# Patient Record
Sex: Female | Born: 1960 | Race: White | Hispanic: No | Smoking: Former smoker
Health system: Southern US, Community
[De-identification: ages and names within clinical notes are randomized; demographics above are authoritative.]

## PROBLEM LIST (undated history)

## (undated) DIAGNOSIS — J45909 Unspecified asthma, uncomplicated: Secondary | ICD-10-CM

## (undated) DIAGNOSIS — R5383 Other fatigue: Secondary | ICD-10-CM

## (undated) DIAGNOSIS — G459 Transient cerebral ischemic attack, unspecified: Secondary | ICD-10-CM

## (undated) DIAGNOSIS — R0600 Dyspnea, unspecified: Secondary | ICD-10-CM

## (undated) HISTORY — PX: TOTAL KNEE ARTHROPLASTY: SHX125

## (undated) HISTORY — DX: Dyspnea, unspecified: R06.00

## (undated) HISTORY — PX: HERNIA REPAIR: SHX51

## (undated) HISTORY — PX: SPLENECTOMY, TOTAL: SHX788

## (undated) HISTORY — DX: Other fatigue: R53.83

## (undated) HISTORY — PX: HIP FRACTURE SURGERY: SHX118

## (undated) HISTORY — PX: ABDOMINAL HYSTERECTOMY: SHX81

## (undated) HISTORY — PX: HYSTEROSCOPY: SHX211

## (undated) HISTORY — PX: BACK SURGERY: SHX140

## (undated) HISTORY — DX: Unspecified asthma, uncomplicated: J45.909

## (undated) HISTORY — DX: Transient cerebral ischemic attack, unspecified: G45.9

---

## 1998-09-29 ENCOUNTER — Other Ambulatory Visit: Admission: RE | Admit: 1998-09-29 | Discharge: 1998-09-29 | Payer: Self-pay

## 1999-09-16 ENCOUNTER — Encounter: Admission: RE | Admit: 1999-09-16 | Discharge: 1999-09-16 | Payer: Self-pay | Admitting: *Deleted

## 2000-10-30 ENCOUNTER — Other Ambulatory Visit: Admission: RE | Admit: 2000-10-30 | Discharge: 2000-10-30 | Payer: Self-pay | Admitting: *Deleted

## 2003-06-13 ENCOUNTER — Ambulatory Visit (HOSPITAL_BASED_OUTPATIENT_CLINIC_OR_DEPARTMENT_OTHER): Admission: RE | Admit: 2003-06-13 | Discharge: 2003-06-13 | Payer: Self-pay | Admitting: Orthopedic Surgery

## 2003-10-04 ENCOUNTER — Inpatient Hospital Stay (HOSPITAL_COMMUNITY): Admission: RE | Admit: 2003-10-04 | Discharge: 2003-10-07 | Payer: Self-pay | Admitting: Orthopedic Surgery

## 2008-04-09 ENCOUNTER — Inpatient Hospital Stay (HOSPITAL_COMMUNITY): Admission: EM | Admit: 2008-04-09 | Discharge: 2008-04-10 | Payer: Self-pay | Admitting: Emergency Medicine

## 2008-04-09 ENCOUNTER — Ambulatory Visit: Payer: Self-pay | Admitting: Internal Medicine

## 2008-04-10 ENCOUNTER — Encounter (INDEPENDENT_AMBULATORY_CARE_PROVIDER_SITE_OTHER): Payer: Self-pay | Admitting: Internal Medicine

## 2008-09-08 ENCOUNTER — Ambulatory Visit (HOSPITAL_COMMUNITY): Admission: RE | Admit: 2008-09-08 | Discharge: 2008-09-08 | Payer: Self-pay | Admitting: Internal Medicine

## 2008-09-08 ENCOUNTER — Encounter: Payer: Self-pay | Admitting: Internal Medicine

## 2008-09-08 ENCOUNTER — Ambulatory Visit: Payer: Self-pay | Admitting: Internal Medicine

## 2009-11-26 ENCOUNTER — Ambulatory Visit (HOSPITAL_COMMUNITY): Admission: RE | Admit: 2009-11-26 | Discharge: 2009-11-26 | Payer: Self-pay | Admitting: Orthopedic Surgery

## 2010-02-13 ENCOUNTER — Ambulatory Visit (HOSPITAL_COMMUNITY): Admission: RE | Admit: 2010-02-13 | Discharge: 2010-02-13 | Payer: Self-pay | Admitting: *Deleted

## 2010-08-01 LAB — POCT I-STAT, CHEM 8
Chloride: 95 mEq/L — ABNORMAL LOW (ref 96–112)
Hemoglobin: 11.9 g/dL — ABNORMAL LOW (ref 12.0–15.0)
Potassium: 3.7 mEq/L (ref 3.5–5.1)

## 2010-08-05 ENCOUNTER — Ambulatory Visit
Admission: RE | Admit: 2010-08-05 | Discharge: 2010-08-05 | Payer: Self-pay | Source: Home / Self Care | Attending: Orthopedic Surgery | Admitting: Orthopedic Surgery

## 2010-08-06 NOTE — Op Note (Addendum)
  Charlotte Wallace, Charlotte Wallace                ACCOUNT NO.:  [id number]  MEDICAL RECORD NO.:  [id number]          PATIENT TYPE:  AMB  LOCATION:  DSC                          FACILITY:  [facility]  PHYSICIAN:  Feliberto Gottron. Turner Daniels, M.D.   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  PROCEDURE:  Right knee arthroscopic debridement chondromalacia with abrasion arthroplasty.  SURGEON:  Feliberto Gottron. Turner Daniels, MD  FIRST ASSISTANT:  Shirl Harris, PA-C  ANESTHETIC:  General LMA.  ESTIMATED BLOOD LOSS:  Minimal.  FLUID REPLACEMENT:  800 mL of crystalloid.  DRAINS PLACED:  None.  TOURNIQUET TIME:  None.  INDICATIONS FOR PROCEDURE:  The patient is a 50 year old woman with catching, popping, and pain in her right knee.  Her left knee has been replaced.  Plain radiographs showed no evidence of significant arthritis.  She has failed conservative measures with anti-inflammatory medicines, exercises, and cortisone injection and desires elective right knee arthroscopy to remove torn cartilage either medial meniscal tear or chondromalacia.  Risks and benefits of surgery have been discussed, questions answered.  DESCRIPTION OF PROCEDURE:  The patient was identified by armband and received preoperative IV antibiotics in the holding area at [facility].  Appropriate anesthetic monitors were attached and general LMA anesthesia was induced with the patient in supine position in operating room #1 after placing appropriate anesthetic monitors. Lateral post was applied to the table.  Right lower extremity prepped and draped in the usual sterile fashion from the ankle to the midthigh. A time-out procedure performed.  We  began the operation by making standard inferomedial and inferolateral peripatellar portals allowing introduction of the arthroscope through the inferolateral portal and the outflow through the inferomedial portal.  Diagnostic arthroscopy revealed grade 3 chondromalacia with small flap tears to the medial facet of patella and the medial femoral condyle had a grade 3 flap tears as well to the medial femoral condyle.  This was over about a 4-mm x 6- mm area.  These flap tears were debrided with a 3.5 Gator sucker shaver down to a small area of bare bone where an abrasion arthroplasty was accomplished.  The ACL and PCL were intact.  The menisci were in excellent condition.  The gutters were cleared medially and laterally and the posterior horns were thoroughly probed.  At this point, the knee was irrigated out with normal saline solution.  The arthroscopic instrument were removed.  A dressing of Xeroform, 4 x 4 dressing sponges, Webril, and Ace wrap was applied.  The patient was awakened, extubated, and taken to the recovery room without difficulty.     Feliberto Gottron. Turner Daniels, M.D.     Ovid Curd  D:  08/05/2010  T:  08/06/2010  Job:  [id number]  Electronically Signed by Gean Birchwood M.D. on 08/06/2010 08:22:59 PM

## 2010-09-20 LAB — COMPREHENSIVE METABOLIC PANEL
AST: 15 U/L (ref 0–37)
Albumin: 3.9 g/dL (ref 3.5–5.2)
Alkaline Phosphatase: 72 U/L (ref 39–117)
BUN: 2 mg/dL — ABNORMAL LOW (ref 6–23)
CO2: 28 mEq/L (ref 19–32)
Calcium: 9.1 mg/dL (ref 8.4–10.5)
GFR calc Af Amer: >60 mL/min (ref >60)
GFR calc non Af Amer: >60 mL/min (ref >60)
Glucose, Bld: 90 mg/dL (ref 70–99)
Potassium: 3.7 mEq/L (ref 3.5–5.1)
Sodium: 138 mEq/L (ref 135–145)

## 2010-09-20 LAB — CBC
MCHC: 32.8 g/dL (ref 30.0–36.0)
MCV: 98.6 fL (ref 78.0–100.0)
Platelets: 186 10*3/uL (ref 150–400)
RBC: 3.49 MIL/uL — ABNORMAL LOW (ref 3.87–5.11)
RBC: 3.86 MIL/uL — ABNORMAL LOW (ref 3.87–5.11)
WBC: 6.3 10*3/uL (ref 4.0–10.5)

## 2010-09-20 LAB — DIFFERENTIAL
Eosinophils Relative: 1 % (ref 0–5)
Lymphs Abs: 1.7 10*3/uL (ref 0.7–4.0)
Neutrophils Relative %: 59 % (ref 43–77)

## 2010-09-20 LAB — TYPE AND SCREEN

## 2010-09-20 LAB — ABO/RH: ABO/RH(D): A POS

## 2010-09-20 LAB — APTT: aPTT: 30 seconds (ref 24–37)

## 2010-09-23 LAB — CREATININE, SERUM
Creatinine, Ser: 0.69 mg/dL (ref 0.4–1.2)
GFR calc Af Amer: >60 mL/min (ref >60)

## 2010-11-19 NOTE — Consult Note (Signed)
NAME:  STEVEN, VEAZIE NO.:  [id number]   MEDICAL RECORD NO.:  [id number]          PATIENT TYPE:  EMS   LOCATION:  MAJO                         FACILITY:  [facility]   PHYSICIAN:  Noel Christmas, MD    DATE OF BIRTH:  02-Sep-1960   DATE OF CONSULTATION:  DATE OF DISCHARGE:                                 CONSULTATION   REFERRING PHYSICIAN:  Hilario Quarry, MD   REASON FOR CONSULTATION:  Recurrent ischemic cerebral event.   This is a 50 year old lady who presented today following acute onset of  weakness and numbness involving right side of her face as well as right  arm and right leg.  Symptoms lasted for about one and half hours prior  to clearing, except for residual tingling involving right hand.  The  patient had a similar episode 3 days ago with numbness and weakness  involving face, arm, and leg on the right but lasting only about 10  minutes.  The patient did not seek medical attention at that time.  There is no previous history of stroke or TIA.  CT of her head showed no  acute intracranial abnormality.  The patient has not been on  antiplatelet therapy and is allergic to aspirin.  She has had no  associated headache with the presenting symptoms.  There was no aphasia  apparently according to the family members.   PAST MEDICAL HISTORY:  Remarkable for hypertension, COPD, and  hypothyroidism as well as nicotine abuse.   CURRENT MEDICATIONS:  1. Norvasc 5 mg per day.  2. Synthroid 137 mcg per day.  3. Combivent 2 puffs t.i.d.  4. Proventil 2 puffs q.4 h.  5. The patient started nicotine patch 21 mg 4 days ago.   FAMILY HISTORY:  Positive for type 2 diabetes mellitus as well as  hypertension and coronary artery disease.  There is no family history of  stroke.   PHYSICAL EXAMINATION:  GENERAL:  Appearance is that of a middle-aged  lady, slender to medium-build who was alert and cooperative in no acute  distress.  She was well oriented to time as well as  place.  Short term  and long term memory were normal.  Affect was appropriate.  HEENT:  Her pupils were equal and reacted normally to light.  Extraocular movement exam showed bilateral mild esotropia.  There was no  diplopia in any field of gaze, nor was there any evidence of visual  field defect.  There was no facial weakness.  Hearing was normal.  Speech and palatal movement were normal.  Coordination of extremities  was normal.  Strength and muscle tone were throughout.  Deep tendon  reflexes were normal and symmetrical throughout.  Plantar responses were  flexor.  Sensory examination was normal.  Carotid auscultation was  normal.   CLINICAL IMPRESSION:  Recurrent transient ischemic attacks involving the  left middle cerebral artery territory.  Left subcortical acute cerebral  infarction cannot be ruled out at this point.   RECOMMENDATIONS:  1. Plavix 75 mg per day.  2. MRI of the brain and MRA of the  brain and neck in the a.m.  3. Echocardiogram.   Thank you for asking me to evaluate Ms. Gille.      Noel Christmas, MD  Electronically Signed     CS/MEDQ  D:  04/09/2008  T:  04/09/2008  Job:  [id number]

## 2010-11-22 NOTE — Op Note (Signed)
NAME:  Charlotte Wallace, Charlotte Wallace                          ACCOUNT NO.:  [id number]   MEDICAL RECORD NO.:  [id number]                   PATIENT TYPE:  INP   LOCATION:  2899                                 FACILITY:  [facility]   PHYSICIAN:  Feliberto Gottron. Turner Daniels, M.D.                DATE OF BIRTH:  [birth year]   DATE OF PROCEDURE:  10/04/2003  DATE OF DISCHARGE:                                 OPERATIVE REPORT   PREOPERATIVE DIAGNOSIS:  End-stage arthritis of the left knee.   POSTOPERATIVE DIAGNOSIS:  End-stage arthritis of the left knee.   PROCEDURE:  Left total knee arthroplasty.  Osteonics Scorpio components, 7  femur, 7 tibia, 5 modified medial patellar button, 10 PF spacer.   SURGEON:  Feliberto Gottron. Turner Daniels, M.D.   FIRST ASSISTANT:  Erskine Squibb B. Jannet Mantis.   ANESTHESIA:  General endotracheal with sciatic nerve block and femoral nerve  block.   FLUIDS REPLACED:  1500 mL crystalloid.   ESTIMATED BLOOD LOSS:  Zero.   TOURNIQUET TIME:  1 hour 15 minutes.   INDICATION FOR PROCEDURE:  A 50 year old woman with end-stage arthritis of  the left knee proven by arthroscopy last year, where we found bare-bone  arthritic changes of the lateral compartment that were not readily visible  on either the x-ray or MRI scan.  Because of the bare-bone arthritic changes  and failure of all other conservative measures, total knee arthroplasty is  elected by the patient.  Risks and benefits of surgery are understood by the  patient.   DESCRIPTION OF PROCEDURE:  The patient identified by arm band, taken to the  operating room at [facility], appropriate anesthetic monitors  were attached and femoral nerve block and sciatic nerve block anesthesia  induced, followed by general endotracheal anesthesia.  Tourniquet applied  high to the left thigh, lateral post and foot positioner applied to the  table, left lower extremity prepped and draped in the usual sterile fashion  from the ankle to the tourniquet.  Limb  wrapped with an Esmarch bandage,  tourniquet inflated to 350 mmHg.  We began the procedure by making the  anterior midline incision through the skin and subcutaneous tissue straight  anterior, about 18 cm in length, centered over the patella.  Small bleeders  in the skin and subcutaneous tissue identified and cauterized with the  electrocautery.  The transverse retinaculum was incised and reflected  medially and the medial parapatellar arthrotomy accomplished.  Prepatellar  fat pad and the anterior one-half of the medial and lateral menisci were  resected.  The superficial medial collateral ligament was elevated from  anterior to posterior off the tibia, keeping it intact distally, and the ACL  and the PCL were reflected with the knee hyperflexed with the  electrocautery.  A posteromedial Z retractor was placed, a posterior McCale  retractor through the notch, and a lateral Hohmann retractor to give [place] our  proximal tibial exposure, and then the proximal tibia was entered with the  Osteonics step drill, followed by the IM rod, with the 0 degree posterior  slope cutting guide attached.  This was pinned into place, allowing  resection of 7-8 mm of bone medially and laterally with the power saw.  We  then entered the distal femur with the step drill 2 mm anterior to the PCL  origin, followed by the IM rod set for 5 degrees left with a 10 mm distal  cut.  The cutting guide was pinned into place and the cut accomplished.  We  sized for a #7 femoral component, placed the pins in 0 degrees of external  rotation, and then hammered the chamfer cutting guide into place and  accomplished the anterior, posterior, and chamfer cuts without difficulty,  followed by the Scorpio box cut after pinning the box cutting guide into  place.  The everted patella was grasped with the patellar cutting guide and  the posterior 8 mm resected since it was relatively thin, sized for a #5  modified medial patellar  button, and drilled.  A #5 left trial femoral  component was hammered into place, a #7 tibial base plate with a #16 PF  spacer was placed, and a 5 modified medial patellar trial button was placed.  The knee was taken through a range of motion.  Excellent stability and  ligamentous tension was noted in full extension, flexion, and through  midflexion.  The rotation of the tibial base plate  was set over the second  metatarsal and the trial components were removed.  The knee was once again  hyperflexed, exposing the proximal tibia.  The base plate for the tibial 7  was then pinned into place and the delta fit keel punch was accomplished.  All bony surfaces were water-picked clean and dried with suction and  sponges.  A double batch of Palacos polymethyl methacrylate with 1500 mg of  Zinacef was mixed and applied to all bony and metallic mating surfaces  except for the posterior condyles of the femur.  In order we then hammered  into place the tibial base plate #7, a #7 left femoral component, the 10 mm  PF spacer was snapped into the base plate after removing excess cement, and  the 5 modified medial patellar button was clamped into place and excess  cement removed.  Once again the surfaces were water-picked clean and medium  Hemovacs placed deep in the wound as the cement hardened.  After the cement  hardened, the patella was unclamped.  The knee was taken through a range of  motion and no thumb pressure was required for excellent tracking of the  patella.  The wound was then closed in layers with #1 Vicryl suture in the  parapatellar arthrotomy, 0 and 2-0 undyed Vicryl suture in the subcutaneous  tissue running, and then skin staples.  A dressing of Xeroform, 4 x 4  dressing sponges, Webril, and an Ace wrap applied.  The patient was then  awakened and taken to the recovery room without difficulty.                                               Feliberto Gottron. Turner Daniels, M.D.   Ovid Curd  D:   10/04/2003  T:  10/04/2003  Job:  [id number]

## 2010-11-22 NOTE — Discharge Summary (Signed)
NAMEEMILI, MCLOUGHLIN                ACCOUNT NO.:  [id number]   MEDICAL RECORD NO.:  [id number]          PATIENT TYPE:  INP   LOCATION:  3019                         FACILITY:  [facility]   PHYSICIAN:  Chauncey Reading, D.O.  DATE OF BIRTH:  [birth year]   DATE OF ADMISSION:  04/09/2008  DATE OF DISCHARGE:  04/10/2008                               DISCHARGE SUMMARY   DISCHARGE DIAGNOSES:  1. Transient ischemic attack.  2. Hypertension.  3. Hypothyroidism.  4. Chronic obstructive pulmonary disease.   DISCHARGE MEDICATIONS:  1. Plavix 75 mg p.o. daily.  2. Norvasc 5 mg p.o. daily.  3. Synthroid 125 mcg p.o. daily.  4. NicoDerm patch 1 daily as instructed.  5. Proventil 2 puffs once every 4 hours as needed.  6. Combivent 3 times daily.   CONDITION AT DISCHARGE:  The patient was admitted for a TIA.  The  patient's symptoms resolved in less than 24 hours.  The patient will  need consideration of decreasing Synthroid dose.  Thyroid-stimulating  hormone should be rechecked.  The patient will follow up with Dr.  Clovis Riley on April 13, 2008, at 2:15 p.m.  The patient is medically  stable to go home.   PROCEDURES:  1. MRI of the head.  Impression:      a.     No acute infarct.      b.     No abnormal intracranial enhancing lesions.      c.     Mild nonspecific white matter-type changes.  2. MRA of the head.  Impression:      a.     Mild irregularity of branch vesicles.  This may represent       limitation of present exam versus atherosclerotic-type changes.      b.     Ectasia of the vertebrobasilar system.  Nonvisualization of       the right posterior inferior communicating artery and both       anterior inferior communicating arteries.      c.     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  3. MRA of the neck.  Impression:      a.     Slightly limited examination  secondary to venous       contamination and motion.      b.     No hemodynamically significant stenosis or significant       irregularity involving either carotid bifurcations.      c.     Left vertebral artery is dominant.  4. CT of the head without contrast.  Impression:      a.     No acute intracranial findings.  Please note that acute       cerebrovascular accident can be occult on CT scan.  5. A 2-D echocardiogram.  Summary:      a.     Overall left ventricular systolic function was normal.  Left  ventricular ejection fraction was estimated to be 65%.  There were       no left ventricular regional wall motion abnormalities.  Left       ventricular wall thickness was mildly to moderately increased.      b.     Aortic valve thickness was mildly increased.      c.     The aortic root was at the upper limits of normal size.  Impression:  Although no acute cardiac source of embolism was  identified, the possibility cannot be ruled out on the basis of the  study.  If clinically indicated, transesophageal echocardiogram would  have greater sensitivity for detection of cardiac sources of embolism.   CONSULTATION:  Dr. Pearlean Brownie, MD, specialty is Neurology.   ADMITTING HISTORY AND PHYSICAL/HISTORY OF PRESENT ILLNESS:  A 50-year-  old woman comes to the hospital complaining of right-sided weakness,  that started at 2:30 p.m.  The patient states the symptoms started  suddenly.  She had to sit down because she experienced right leg and arm  weakness and right face tingling.  Her daughter called EMS as soon as  symptoms started.  The patient had a previous episode on Thursday before  admission in the same side that resolved after 50 minutes.  The patient  denies any speech difficulties although she states that she was not  talking very much.  By the time the patient was seen, all symptoms had  resolved except for tingling in her right arm.  The patient states that  symptoms were associated  with headache.  The headache is in the frontal  region and changes in intensity with change of head position.  The  headache is alleviated with Tylenol.  The patient did not complain of  nasal secretions.  She suffers from these headaches frequently.   ALLERGIES:  ASPIRIN, as an infant developed some side effects due to  ASPIRIN.   PHYSICAL EXAMINATION:  VITAL SIGNS:  Blood pressure 158/88, pulse 81,  respiratory rate 20, oxygen saturation 100 on 2 liters.  GENERAL:  NAD.  Eyes, EOMI.  No scleral icterus.  ENT, moist oral  mucosa.  No exudates.  NECK:  No JVD, no masses.  RESPIRATORY:  Expiratory wheezes from mid lung to base bilaterally, no  crackles, no rhonchi.  CARDIOVASCULAR:  Regular rate and rhythm.  No murmurs, rubs, or gallops.  GASTROINTESTINAL:  Bowel sounds positive, soft and depressible,  nontender, nondistended.  EXTREMITIES:  No edema, no cyanosis.  Tingling in left arm.  LYMPHADENOPATHY:  No lymphadenopathy.  MUSCULOSKELETAL:  A 4/5 strength on right upper extremity, 5/5 strength  on left upper extremity and lower extremities bilaterally.  NEUROLOGIC:  Alert and oriented x3, cranial nerves II-XII intact, left  arm has decreased light touch sensation.   ADMISSION LABORATORY:  White blood cells 6.2, hemoglobin 13.6,  hematocrit 39.6, platelets 180.  Sodium 139, potassium 3.9, chloride  103, bicarbonate 27, BUN 4, creatinine 0.7, glucose 97.  PT 12.4, INR  0.9, PTT 28.   HOSPITAL COURSE:  1. Transient ischemic attack:  By the time the patient was evaluated      in the emergency room, all symptoms had resolved except for some      mild tingling in her right arm.  The patient was started on Plavix      because of allergy to aspirin.  The patient was admitted for      observation.  The patient had multiple risk factors including  smoking, hypertension, and prior TIA.  CT of the head without      contrast was done, which showed no acute intracranial findings.       Neurology was consulted, and they recommended the patient be      started on Plavix 75 mg per day.  MRI of the brain and MRA of the      brain and neck which showed above-mentioned findings.  A 2-D echo      was done, which showed above-mentioned findings.  A 2-D echo did      not show a cardiac source of embolism, but the patient may need a      transesophageal echocardiography on an outpatient basis, which has      greater sensitivity.  HIV antibody, nonreactive.  UDS was negative.      Hemoglobin A1c was 5.  Cardiac enzymes were negative.  EKGs were      normal.  The patient will be discharged on Plavix because of the      allergy to aspirin.  The patient will follow up with her primary      care physician.  The patient will follow up with Dr. Pearlean Brownie in 2-3      months.  It is also recommended that the patient stop smoking.  2. Hypothyroidism:  It was found the patient had a low TSH of 0.268.      Primary care physician should further assess the need to decrease      Synthroid or recheck thyroid hormone levels.  The patient will be      discharged on a decreased dosage of Synthroid.  3. Hypertension:  The patient was started on Norvasc 5 mg p.o. daily.      Blood pressure was stable at goal throughout hospitalization.  The      patient will follow up with primary care Birtha Hatler for further      management.  4. Chronic obstructive pulmonary disease:  The patient did not have      any complaints of shortness of breath while in the hospital.  The      patient will continue with Proventil 2 puffs once every 4 hours as      needed on an outpatient basis.  The patient will follow up with      primary care physician for further management.   DISCHARGE VITAL SIGNS:  Temperature 98.2, blood pressure 156/87, pulse  81, respiratory rate 18, oxygen saturation of 97% on 2 liters.   DISCHARGE LABORATORY:  Sodium 138, potassium 3.6, chloride 106,  bicarbonate 26, glucose 97, BUN 4, creatinine 0.65.   White blood cells  7.4, hemoglobin 12.1, hematocrit 35.9, platelets 136.      Danne Harbor, MD  Electronically Signed      Chauncey Reading, D.O.  Electronically Signed    RV/MEDQ  D:  05/29/2008  T:  05/30/2008  Job:  [id number]   cc:   Noel Christmas, MD

## 2010-11-22 NOTE — Discharge Summary (Signed)
NAME:  LENOLA, LOCKNER                          ACCOUNT NO.:  [id number]   MEDICAL RECORD NO.:  [id number]                   PATIENT TYPE:  INP   LOCATION:  5036                                 FACILITY:  [facility]   PHYSICIAN:  Feliberto Gottron. Turner Daniels, M.D.                DATE OF BIRTH:  01/28/1961   DATE OF ADMISSION:  10/04/2003  DATE OF DISCHARGE:  10/07/2003                                 DISCHARGE SUMMARY   No dictation for this job.      Laural Benes. Jannet Mantis.                     Feliberto Gottron. Turner Daniels, M.D.    Merita Norton  D:  10/30/2003  T:  10/30/2003  Job:  [id number]

## 2010-11-22 NOTE — Op Note (Signed)
NAME:  Charlotte Wallace, Charlotte Wallace                          ACCOUNT NO.:  [id number]   MEDICAL RECORD NO.:  [id number]                   PATIENT TYPE:  AMB   LOCATION:  DSC                                  FACILITY:  [facility]   PHYSICIAN:  Feliberto Gottron. Turner Daniels, M.D.                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   POSTOPERATIVE DIAGNOSES:  Left knee medial meniscal tear, lateral meniscal  tear, chondromalacia of the lateral femoral condyle, grade 4 chondromalacia  of the lateral tibial condyle focal grade 4.   OPERATION PERFORMED:  Left knee partial arthroscopic medial and lateral  meniscectomies, debridement of chondromalacia and removal of loose bodies  from the left knee.   SURGEON:  Feliberto Gottron. Turner Daniels, M.D.   ANESTHESIA:  General LMA.   ESTIMATED BLOOD LOSS:  Minimal.   FLUIDS REPLACED:  600 mL crystalloids.   DRAINS:  None.   TOURNIQUET TIME:  None.   INDICATIONS FOR PROCEDURE:  The patient is a 50 year old woman who has had  prior surgery to her left knee under the care of Dr. __________ where he did  open medial meniscectomy back in the early 90s.  She now has recurrent  catching, popping and pain in her left knee and because of this is taken for  arthroscopic evaluation and treatment of her knee.  Findings prior to  surgery included x-rays showing medial joint line arthritis with a loss of  articular cartilage height about 1 or 2 mm squaring off of the medial  femoral condyle.  She is also tender along the lateral jointline where there  is some palpable clicking and popping.  She has failed conservative  treatment with observation and anti-inflammatory medicines and desires  elective arthroscopic evaluation and treatment of her left knee.   DESCRIPTION OF PROCEDURE:  The patient was identified by arm band and take  to the operating room at [facility].  Appropriate  anesthetic monitors were attached.  General LMA anesthesia was induced with  the patient in the supine position.  Lateral post was applied to the table  and left lower extremity was prepped and draped in the usual sterile fashion  from the ankle to the midthigh and using a #11 blade, standard inferomedial  and inferolateral peripatellar portals were then made allowing introduction  of the arthroscope through the inferolateral portal and the outflow through  the inferomedial portal.  Diagnostic arthroscopy revealed grade 3  chondromalacia along the apex of the patella where there was a vertical  crack and this was debrided back to stable margins with a 3.5 Gator sucker  shaver.  Moving into the medial compartment, the prior medial meniscectomy  was visualized.  There were some remnants of medial meniscus.  They were  flipping into the joint and these were retracted.  Interestingly, the  articular cartilage of the medial side had grade 1 and grade 2  chondromalacia and did not require much debridement.  The cruciate ligaments  were noted to be intact, moving to the lateral side.  Complex tearing of  almost the entire lateral meniscus was noted and debrided back to stable  margin with a 3.5 Gator sucker shaver.  The patient also had bare bone  arthritic changes of the lateral femoral condyle and along the distal and  posterior weightbearing surfaces and clinical grade 4 chondromalacia or the  lateral tibial condyle near the lateral edge of the lateral meniscus.  This  was also debrided back to stable margins.  The gutters were then cleared.  The knee was washed out with normal saline solution.  The arthroscopic  instruments were removed and a dressing of Xeroform, 4 x 4 dressing sponges,  Webril and Ace wrap was applied.  The patient was awakened and taken to the  recovery room without difficulty.                                                Feliberto Gottron. Turner Daniels, M.D.    Ovid Curd  D:  06/13/2003  T:  06/14/2003  Job:  [id number]

## 2010-11-22 NOTE — Discharge Summary (Signed)
NAME:  Charlotte Wallace, Charlotte Wallace                          ACCOUNT NO.:  [id number]   MEDICAL RECORD NO.:  [id number]                   PATIENT TYPE:  INP   LOCATION:  5036                                 FACILITY:  [facility]   PHYSICIAN:  Feliberto Gottron. Turner Daniels, M.D.                DATE OF BIRTH:  October 05, 1960   DATE OF ADMISSION:  10/04/2003  DATE OF DISCHARGE:  10/07/2003                                 DISCHARGE SUMMARY   PRIMARY DIAGNOSIS FOR THIS ADMISSION:  End-stage degenerative joint disease  of the left knee.   PROCEDURE WHILE IN HOSPITAL:  Left total knee arthroplasty.   SECONDARY DIAGNOSIS:  Hypothyroidism.   HISTORY OF PRESENT ILLNESS:  The patient is a 50 year old woman with end-  stage arthritis of the left knee, proven by arthroscopy done previously  where there was positive bone-on-bone arthritic changes in the lateral  compartment that were not readily visible on the knee x-ray or MRI scan.  Because of the bare bone arthritic changes and failure of conservative  measures, including NSAIDs, rest, steroid injections, the patient wishes to  proceed with total knee arthroplasty after discussing risks versus benefits.   The patient is allergic to ASPIRIN.   She is currently taking levothyroxine, Estradiol and Allegra as well as  occasional Tylenol No. 3 for pain.   PAST MEDICAL HISTORY:  Positive for usual childhood diseases.  Also positive  for heart murmur and hypothyroidism.   PAST SURGICAL HISTORY:  1. Appendectomy in 1977.  2. Left knee open meniscectomy in 1978.  3. Left knee scope 2001.  4. Hysterectomy 1987.  5. Left knee again in 1989.  6. Left knee in 2004.   No difficulties with DVT.   SOCIAL HISTORY:  One pack per day tobacco, occasional ethanol, no IV drug  use.  She is divorced.   FAMILY HISTORY:  Mother is alive at 53 with positive hypertension.  Father  is alive at 26 with positive CAD and hypertension.   REVIEW OF SYSTEMS:  Positive glasses and she is legally  blind despite the  glasses, a.m. cough and denies any shortness of breath or chest pain.   PHYSICAL EXAMINATION:  VITAL SIGNS:  The patient's temperature is 98; pulse  52; respirations 20; blood pressure 128/100.  She is a 5 foot 8 inch, 161  pound female.  HEENT:  Head normocephalic, atraumatic.  Ears, TMs are clear.  Eyes, pupils  equal, round and reactive to light and accommodation.  Nose and throat benign.  NECK:  Supple.  Full range of motion.  CHEST:  Clear to auscultation and percussion.  HEART:  Regular rate and rhythm.  ABDOMEN:  Soft, nontender, no masses, bowel sounds 2+.  EXTREMITIES:  Within normal limits, except for left knee which has a well  healed normal scar.  Range of motion 5 to 120 degrees, positive pain and  crepitus, positive trace effusion, ligamentously stable.  X-rays show decreased cartilage height, medial greater than lateral  compartment.  Photographs from arthroscopy clearly show bone-on-bone  changes.  Preoperative labs including CBC, CMET, chest x-ray, EKG, PT and  PTT are all within normal limits, with the exception of BUN of 5 and albumin  of 3.4.   HOSPITAL COURSE:  On the day of admission the patient was taken to the  operating room at [facility] where she underwent a left knee total  knee arthroplasty using [employer] [place] components, No. 7 femur, No. 7  tibia, No. 5 modified medial patellar button and a 10pF spacer.  All  components cemented.  Medium Hemovac was placed __________ into the knee.  She was placed on preoperative antibiotics.  She was placed on postoperative  Coumadin for prophylaxis with a target INR of 1.5 to 2.  She was placed on  PCA Dilaudid for pain control and she was begun with CPM and PT immediately  postoperatively.   On postoperative day one, the patient was awake and alert.  Positive nausea  the night before, but none that morning.  She had been taking fluids well.  Vital signs were stable.  Dressings were  dry.  She was neurovascularly  intact to motors and light touch and otherwise ready to begin out of bed  physical therapy.  Social services were consulted and set up her home health  care with [facility].   On postoperative day two, the patient was without complaint, except for  moderate pain in the left knee.  No difficulties voiding, eating or  drinking.  T-Max 99.4, hemoglobin 9.7, WBC of 10.3, INR of 1.1  Dressing was  dry.  Calf and thigh are soft.  Otherwise, neurovascularly intact.  She  continued physical therapy.   On postoperative day three, the patient was without complaint.  She was  transferring without assistance.  She walked up and down the hall, and had  done stairs and was ready to go home as her PT goals had been met.  At the  time of discharge she was afebrile, had previously been up to 101.0.  Her  wound was benign.  Hemoglobin 9.5, WBC 9.0, INR of 1.4, calf was soft and  nontender and she was neurovascularly intact.  She was medically stable and  improved compared to preoperative condition.  She was discharged home to the  care of her family.   ACTIVITIES:  Weightbearing as tolerated with walker and total knee  precautions.   WOUND:  Dressing changes q.d.  She must keep clean and dry.  She may shower  on postoperative day number seven.   DIET:  Regular.   She will need home health physical therapy and home health CPM five hours  per day, home health OT and RN if needed for laboratory draws.   MEDICATIONS:  Restart preoperative medicines as well as Coumadin per  pharmacy protocol, managed by [facility] for two weeks  postoperatively.  Tylox one to two q.4 h. p.r.n. pain.  She will follow up  with [place] in approximately one week's time for followup, sooner if she is  having any increase in temperature, any drainage from the wound or pain not  well controlled by oral pain medicines.     Laural Benes. Jannet Mantis.                     Feliberto Gottron. Turner Daniels,  M.D.    Merita Norton  D:  10/30/2003  T:  10/30/2003  Job:  [id number]

## 2010-12-13 ENCOUNTER — Other Ambulatory Visit: Payer: Self-pay | Admitting: Neurology

## 2010-12-13 DIAGNOSIS — R209 Unspecified disturbances of skin sensation: Secondary | ICD-10-CM

## 2010-12-13 DIAGNOSIS — G43019 Migraine without aura, intractable, without status migrainosus: Secondary | ICD-10-CM

## 2010-12-20 ENCOUNTER — Ambulatory Visit
Admission: RE | Admit: 2010-12-20 | Discharge: 2010-12-20 | Disposition: A | Discharge status: Home / Self Care | Payer: Medicare Other | Source: Ambulatory Visit | Attending: Neurology | Admitting: Neurology

## 2010-12-20 DIAGNOSIS — R209 Unspecified disturbances of skin sensation: Secondary | ICD-10-CM

## 2010-12-20 DIAGNOSIS — G43019 Migraine without aura, intractable, without status migrainosus: Secondary | ICD-10-CM

## 2010-12-20 MED ORDER — GADOBENATE DIMEGLUMINE 529 MG/ML IV SOLN
17.0000 mL | Freq: Once | INTRAVENOUS | Status: AC | PRN
  Administered 2010-12-20: 17 mL via INTRAVENOUS

## 2011-04-07 LAB — CBC
HCT: 35.9 — ABNORMAL LOW
HCT: 39.6
Hemoglobin: 12.1
RBC: 3.66 — ABNORMAL LOW
RBC: 4.09
RDW: 12.6
RDW: 13.1
WBC: 6.2
WBC: 7.4

## 2011-04-07 LAB — CARDIAC PANEL(CRET KIN+CKTOT+MB+TROPI)
CK, MB: 1.3
CK, MB: 1.6
Total CK: 54
Total CK: 57

## 2011-04-07 LAB — DRUGS OF ABUSE SCREEN W/O ALC, ROUTINE URINE
Benzodiazepines.: NEGATIVE
Cocaine Metabolites: NEGATIVE
Creatinine,U: 35.6
Methadone: NEGATIVE
Opiate Screen, Urine: NEGATIVE
Propoxyphene: NEGATIVE

## 2011-04-07 LAB — COMPREHENSIVE METABOLIC PANEL
Albumin: 3.6
BUN: 4 — ABNORMAL LOW
Calcium: 9.4
Chloride: 106
GFR calc Af Amer: >60
GFR calc non Af Amer: >60
Glucose, Bld: 100 — ABNORMAL HIGH
Potassium: 3.9
Total Bilirubin: 0.5

## 2011-04-07 LAB — DIFFERENTIAL
Basophils Absolute: 0
Basophils Relative: 1
Lymphocytes Relative: 36
Monocytes Absolute: 0.6
Monocytes Relative: 9
Neutro Abs: 3.4
Neutrophils Relative %: 54

## 2011-04-07 LAB — BASIC METABOLIC PANEL
BUN: 4 — ABNORMAL LOW
CO2: 26
Chloride: 106
Glucose, Bld: 97
Potassium: 3.6
Sodium: 138

## 2011-04-07 LAB — URINALYSIS, ROUTINE W REFLEX MICROSCOPIC
Bilirubin Urine: NEGATIVE
Hgb urine dipstick: NEGATIVE
Nitrite: NEGATIVE
Protein, ur: NEGATIVE
Urobilinogen, UA: 0.2

## 2011-04-07 LAB — POCT I-STAT, CHEM 8
Calcium, Ion: 1.27
Chloride: 103
Glucose, Bld: 97
HCT: 40
Hemoglobin: 13.6

## 2011-04-07 LAB — PROTIME-INR: INR: 0.9

## 2011-04-07 LAB — LIPID PANEL
Cholesterol: 128
HDL: 48
LDL Cholesterol: 59
Triglycerides: 103

## 2011-04-07 LAB — GLUCOSE, CAPILLARY
Glucose-Capillary: 114 — ABNORMAL HIGH
Glucose-Capillary: 115 — ABNORMAL HIGH

## 2011-04-07 LAB — APTT: aPTT: 28

## 2011-04-07 LAB — HEMOGLOBIN A1C: Mean Plasma Glucose: 97

## 2011-04-07 LAB — CK TOTAL AND CKMB (NOT AT ARMC)
Relative Index: INVALID
Relative Index: INVALID

## 2011-04-07 LAB — TROPONIN I: Troponin I: 0.03

## 2011-08-11 DIAGNOSIS — G43019 Migraine without aura, intractable, without status migrainosus: Secondary | ICD-10-CM | POA: Diagnosis not present

## 2011-08-11 DIAGNOSIS — R51 Headache: Secondary | ICD-10-CM | POA: Diagnosis not present

## 2011-08-11 DIAGNOSIS — R209 Unspecified disturbances of skin sensation: Secondary | ICD-10-CM | POA: Diagnosis not present

## 2011-08-14 DIAGNOSIS — IMO0001 Reserved for inherently not codable concepts without codable children: Secondary | ICD-10-CM | POA: Diagnosis not present

## 2011-08-14 DIAGNOSIS — R51 Headache: Secondary | ICD-10-CM | POA: Diagnosis not present

## 2011-08-14 DIAGNOSIS — R209 Unspecified disturbances of skin sensation: Secondary | ICD-10-CM | POA: Diagnosis not present

## 2011-08-14 DIAGNOSIS — M6281 Muscle weakness (generalized): Secondary | ICD-10-CM | POA: Diagnosis not present

## 2011-08-14 DIAGNOSIS — G43019 Migraine without aura, intractable, without status migrainosus: Secondary | ICD-10-CM | POA: Diagnosis not present

## 2011-08-18 DIAGNOSIS — M6281 Muscle weakness (generalized): Secondary | ICD-10-CM | POA: Diagnosis not present

## 2011-08-18 DIAGNOSIS — R209 Unspecified disturbances of skin sensation: Secondary | ICD-10-CM | POA: Diagnosis not present

## 2011-08-18 DIAGNOSIS — G43019 Migraine without aura, intractable, without status migrainosus: Secondary | ICD-10-CM | POA: Diagnosis not present

## 2011-08-18 DIAGNOSIS — R51 Headache: Secondary | ICD-10-CM | POA: Diagnosis not present

## 2011-08-18 DIAGNOSIS — IMO0001 Reserved for inherently not codable concepts without codable children: Secondary | ICD-10-CM | POA: Diagnosis not present

## 2011-08-22 DIAGNOSIS — IMO0001 Reserved for inherently not codable concepts without codable children: Secondary | ICD-10-CM | POA: Diagnosis not present

## 2011-08-22 DIAGNOSIS — R51 Headache: Secondary | ICD-10-CM | POA: Diagnosis not present

## 2011-08-22 DIAGNOSIS — M6281 Muscle weakness (generalized): Secondary | ICD-10-CM | POA: Diagnosis not present

## 2011-08-22 DIAGNOSIS — R209 Unspecified disturbances of skin sensation: Secondary | ICD-10-CM | POA: Diagnosis not present

## 2011-08-22 DIAGNOSIS — G43019 Migraine without aura, intractable, without status migrainosus: Secondary | ICD-10-CM | POA: Diagnosis not present

## 2011-08-25 DIAGNOSIS — R209 Unspecified disturbances of skin sensation: Secondary | ICD-10-CM | POA: Diagnosis not present

## 2011-08-25 DIAGNOSIS — M6281 Muscle weakness (generalized): Secondary | ICD-10-CM | POA: Diagnosis not present

## 2011-08-25 DIAGNOSIS — R51 Headache: Secondary | ICD-10-CM | POA: Diagnosis not present

## 2011-08-25 DIAGNOSIS — G43019 Migraine without aura, intractable, without status migrainosus: Secondary | ICD-10-CM | POA: Diagnosis not present

## 2011-08-25 DIAGNOSIS — IMO0001 Reserved for inherently not codable concepts without codable children: Secondary | ICD-10-CM | POA: Diagnosis not present

## 2011-09-01 DIAGNOSIS — R05 Cough: Secondary | ICD-10-CM | POA: Diagnosis not present

## 2011-09-01 DIAGNOSIS — R059 Cough, unspecified: Secondary | ICD-10-CM | POA: Diagnosis not present

## 2011-09-03 DIAGNOSIS — R209 Unspecified disturbances of skin sensation: Secondary | ICD-10-CM | POA: Diagnosis not present

## 2011-09-03 DIAGNOSIS — IMO0001 Reserved for inherently not codable concepts without codable children: Secondary | ICD-10-CM | POA: Diagnosis not present

## 2011-09-03 DIAGNOSIS — G43019 Migraine without aura, intractable, without status migrainosus: Secondary | ICD-10-CM | POA: Diagnosis not present

## 2011-09-03 DIAGNOSIS — R51 Headache: Secondary | ICD-10-CM | POA: Diagnosis not present

## 2011-09-03 DIAGNOSIS — M6281 Muscle weakness (generalized): Secondary | ICD-10-CM | POA: Diagnosis not present

## 2011-09-08 DIAGNOSIS — M6281 Muscle weakness (generalized): Secondary | ICD-10-CM | POA: Diagnosis not present

## 2011-09-08 DIAGNOSIS — G43019 Migraine without aura, intractable, without status migrainosus: Secondary | ICD-10-CM | POA: Diagnosis not present

## 2011-09-08 DIAGNOSIS — R51 Headache: Secondary | ICD-10-CM | POA: Diagnosis not present

## 2011-09-08 DIAGNOSIS — IMO0001 Reserved for inherently not codable concepts without codable children: Secondary | ICD-10-CM | POA: Diagnosis not present

## 2011-09-08 DIAGNOSIS — H00029 Hordeolum internum unspecified eye, unspecified eyelid: Secondary | ICD-10-CM | POA: Diagnosis not present

## 2011-09-08 DIAGNOSIS — R209 Unspecified disturbances of skin sensation: Secondary | ICD-10-CM | POA: Diagnosis not present

## 2011-09-10 DIAGNOSIS — R209 Unspecified disturbances of skin sensation: Secondary | ICD-10-CM | POA: Diagnosis not present

## 2011-09-10 DIAGNOSIS — G43019 Migraine without aura, intractable, without status migrainosus: Secondary | ICD-10-CM | POA: Diagnosis not present

## 2011-09-10 DIAGNOSIS — R51 Headache: Secondary | ICD-10-CM | POA: Diagnosis not present

## 2011-09-10 DIAGNOSIS — IMO0001 Reserved for inherently not codable concepts without codable children: Secondary | ICD-10-CM | POA: Diagnosis not present

## 2011-09-10 DIAGNOSIS — M6281 Muscle weakness (generalized): Secondary | ICD-10-CM | POA: Diagnosis not present

## 2011-09-15 DIAGNOSIS — IMO0001 Reserved for inherently not codable concepts without codable children: Secondary | ICD-10-CM | POA: Diagnosis not present

## 2011-09-15 DIAGNOSIS — R209 Unspecified disturbances of skin sensation: Secondary | ICD-10-CM | POA: Diagnosis not present

## 2011-09-15 DIAGNOSIS — R51 Headache: Secondary | ICD-10-CM | POA: Diagnosis not present

## 2011-09-15 DIAGNOSIS — M6281 Muscle weakness (generalized): Secondary | ICD-10-CM | POA: Diagnosis not present

## 2011-09-15 DIAGNOSIS — G43019 Migraine without aura, intractable, without status migrainosus: Secondary | ICD-10-CM | POA: Diagnosis not present

## 2011-09-17 DIAGNOSIS — M6281 Muscle weakness (generalized): Secondary | ICD-10-CM | POA: Diagnosis not present

## 2011-09-17 DIAGNOSIS — G43019 Migraine without aura, intractable, without status migrainosus: Secondary | ICD-10-CM | POA: Diagnosis not present

## 2011-09-17 DIAGNOSIS — R51 Headache: Secondary | ICD-10-CM | POA: Diagnosis not present

## 2011-09-17 DIAGNOSIS — R209 Unspecified disturbances of skin sensation: Secondary | ICD-10-CM | POA: Diagnosis not present

## 2011-09-17 DIAGNOSIS — IMO0001 Reserved for inherently not codable concepts without codable children: Secondary | ICD-10-CM | POA: Diagnosis not present

## 2011-09-22 DIAGNOSIS — M6281 Muscle weakness (generalized): Secondary | ICD-10-CM | POA: Diagnosis not present

## 2011-09-22 DIAGNOSIS — G43019 Migraine without aura, intractable, without status migrainosus: Secondary | ICD-10-CM | POA: Diagnosis not present

## 2011-09-22 DIAGNOSIS — R51 Headache: Secondary | ICD-10-CM | POA: Diagnosis not present

## 2011-09-22 DIAGNOSIS — R209 Unspecified disturbances of skin sensation: Secondary | ICD-10-CM | POA: Diagnosis not present

## 2011-09-22 DIAGNOSIS — IMO0001 Reserved for inherently not codable concepts without codable children: Secondary | ICD-10-CM | POA: Diagnosis not present

## 2011-09-24 DIAGNOSIS — G43019 Migraine without aura, intractable, without status migrainosus: Secondary | ICD-10-CM | POA: Diagnosis not present

## 2011-09-24 DIAGNOSIS — R51 Headache: Secondary | ICD-10-CM | POA: Diagnosis not present

## 2011-09-24 DIAGNOSIS — IMO0001 Reserved for inherently not codable concepts without codable children: Secondary | ICD-10-CM | POA: Diagnosis not present

## 2011-09-24 DIAGNOSIS — R209 Unspecified disturbances of skin sensation: Secondary | ICD-10-CM | POA: Diagnosis not present

## 2011-09-24 DIAGNOSIS — M6281 Muscle weakness (generalized): Secondary | ICD-10-CM | POA: Diagnosis not present

## 2011-10-01 DIAGNOSIS — M6281 Muscle weakness (generalized): Secondary | ICD-10-CM | POA: Diagnosis not present

## 2011-10-01 DIAGNOSIS — G43019 Migraine without aura, intractable, without status migrainosus: Secondary | ICD-10-CM | POA: Diagnosis not present

## 2011-10-01 DIAGNOSIS — R51 Headache: Secondary | ICD-10-CM | POA: Diagnosis not present

## 2011-10-01 DIAGNOSIS — IMO0001 Reserved for inherently not codable concepts without codable children: Secondary | ICD-10-CM | POA: Diagnosis not present

## 2011-10-01 DIAGNOSIS — R209 Unspecified disturbances of skin sensation: Secondary | ICD-10-CM | POA: Diagnosis not present

## 2011-11-25 DIAGNOSIS — R209 Unspecified disturbances of skin sensation: Secondary | ICD-10-CM | POA: Diagnosis not present

## 2011-11-25 DIAGNOSIS — G43019 Migraine without aura, intractable, without status migrainosus: Secondary | ICD-10-CM | POA: Diagnosis not present

## 2011-11-25 DIAGNOSIS — R4701 Aphasia: Secondary | ICD-10-CM | POA: Diagnosis not present

## 2011-11-25 DIAGNOSIS — R51 Headache: Secondary | ICD-10-CM | POA: Diagnosis not present

## 2011-12-03 DIAGNOSIS — R4701 Aphasia: Secondary | ICD-10-CM | POA: Diagnosis not present

## 2011-12-03 DIAGNOSIS — R209 Unspecified disturbances of skin sensation: Secondary | ICD-10-CM | POA: Diagnosis not present

## 2011-12-03 DIAGNOSIS — G43019 Migraine without aura, intractable, without status migrainosus: Secondary | ICD-10-CM | POA: Diagnosis not present

## 2012-01-19 DIAGNOSIS — M62838 Other muscle spasm: Secondary | ICD-10-CM | POA: Diagnosis not present

## 2012-01-19 DIAGNOSIS — M549 Dorsalgia, unspecified: Secondary | ICD-10-CM | POA: Diagnosis not present

## 2012-03-16 ENCOUNTER — Other Ambulatory Visit (HOSPITAL_COMMUNITY): Payer: Self-pay | Admitting: Orthopedic Surgery

## 2012-03-16 DIAGNOSIS — M25539 Pain in unspecified wrist: Secondary | ICD-10-CM | POA: Diagnosis not present

## 2012-03-16 DIAGNOSIS — M25562 Pain in left knee: Secondary | ICD-10-CM

## 2012-03-30 ENCOUNTER — Other Ambulatory Visit (HOSPITAL_COMMUNITY): Payer: Medicare Other

## 2012-03-30 ENCOUNTER — Encounter (HOSPITAL_COMMUNITY): Payer: Medicare Other

## 2012-04-05 ENCOUNTER — Encounter (HOSPITAL_COMMUNITY)
Admission: RE | Admit: 2012-04-05 | Discharge: 2012-04-05 | Disposition: A | Discharge status: Home / Self Care | Payer: Medicare Other | Source: Ambulatory Visit | Attending: Orthopedic Surgery | Admitting: Orthopedic Surgery

## 2012-04-05 ENCOUNTER — Ambulatory Visit (HOSPITAL_COMMUNITY)
Admission: RE | Admit: 2012-04-05 | Discharge: 2012-04-05 | Disposition: A | Discharge status: Home / Self Care | Payer: Medicare Other | Source: Ambulatory Visit | Attending: Orthopedic Surgery | Admitting: Orthopedic Surgery

## 2012-04-05 DIAGNOSIS — M25562 Pain in left knee: Secondary | ICD-10-CM

## 2012-04-05 DIAGNOSIS — M25569 Pain in unspecified knee: Secondary | ICD-10-CM | POA: Insufficient documentation

## 2012-04-05 DIAGNOSIS — Z96659 Presence of unspecified artificial knee joint: Secondary | ICD-10-CM | POA: Diagnosis not present

## 2012-04-05 MED ORDER — TECHNETIUM TC 99M MEDRONATE IV KIT
25.0000 | PACK | Freq: Once | INTRAVENOUS | Status: AC | PRN
  Administered 2012-04-05: 25 via INTRAVENOUS

## 2012-04-26 DIAGNOSIS — G43019 Migraine without aura, intractable, without status migrainosus: Secondary | ICD-10-CM | POA: Diagnosis not present

## 2012-04-26 DIAGNOSIS — R4701 Aphasia: Secondary | ICD-10-CM | POA: Diagnosis not present

## 2012-04-26 DIAGNOSIS — R51 Headache: Secondary | ICD-10-CM | POA: Diagnosis not present

## 2012-04-26 DIAGNOSIS — R209 Unspecified disturbances of skin sensation: Secondary | ICD-10-CM | POA: Diagnosis not present

## 2012-05-03 DIAGNOSIS — J449 Chronic obstructive pulmonary disease, unspecified: Secondary | ICD-10-CM | POA: Diagnosis not present

## 2012-06-07 DIAGNOSIS — J209 Acute bronchitis, unspecified: Secondary | ICD-10-CM | POA: Diagnosis not present

## 2012-06-23 DIAGNOSIS — J40 Bronchitis, not specified as acute or chronic: Secondary | ICD-10-CM | POA: Diagnosis not present

## 2012-06-24 DIAGNOSIS — J449 Chronic obstructive pulmonary disease, unspecified: Secondary | ICD-10-CM | POA: Diagnosis not present

## 2012-06-24 DIAGNOSIS — R0602 Shortness of breath: Secondary | ICD-10-CM | POA: Diagnosis not present

## 2012-06-24 DIAGNOSIS — J209 Acute bronchitis, unspecified: Secondary | ICD-10-CM | POA: Diagnosis present

## 2012-06-24 DIAGNOSIS — J440 Chronic obstructive pulmonary disease with acute lower respiratory infection: Secondary | ICD-10-CM | POA: Diagnosis not present

## 2012-06-24 DIAGNOSIS — J180 Bronchopneumonia, unspecified organism: Secondary | ICD-10-CM | POA: Diagnosis not present

## 2012-06-24 DIAGNOSIS — I10 Essential (primary) hypertension: Secondary | ICD-10-CM | POA: Diagnosis not present

## 2012-06-24 DIAGNOSIS — J9600 Acute respiratory failure, unspecified whether with hypoxia or hypercapnia: Secondary | ICD-10-CM | POA: Diagnosis not present

## 2012-06-24 DIAGNOSIS — J45901 Unspecified asthma with (acute) exacerbation: Secondary | ICD-10-CM | POA: Diagnosis present

## 2012-06-24 DIAGNOSIS — Z79899 Other long term (current) drug therapy: Secondary | ICD-10-CM | POA: Diagnosis not present

## 2012-06-24 DIAGNOSIS — J441 Chronic obstructive pulmonary disease with (acute) exacerbation: Secondary | ICD-10-CM | POA: Diagnosis present

## 2012-06-24 DIAGNOSIS — Z7902 Long term (current) use of antithrombotics/antiplatelets: Secondary | ICD-10-CM | POA: Diagnosis not present

## 2012-06-24 DIAGNOSIS — E871 Hypo-osmolality and hyponatremia: Secondary | ICD-10-CM | POA: Diagnosis not present

## 2012-06-24 DIAGNOSIS — N289 Disorder of kidney and ureter, unspecified: Secondary | ICD-10-CM | POA: Diagnosis not present

## 2012-06-24 DIAGNOSIS — J4489 Other specified chronic obstructive pulmonary disease: Secondary | ICD-10-CM | POA: Diagnosis not present

## 2012-06-25 DIAGNOSIS — J180 Bronchopneumonia, unspecified organism: Secondary | ICD-10-CM | POA: Diagnosis not present

## 2012-06-25 DIAGNOSIS — E871 Hypo-osmolality and hyponatremia: Secondary | ICD-10-CM | POA: Diagnosis not present

## 2012-06-25 DIAGNOSIS — J449 Chronic obstructive pulmonary disease, unspecified: Secondary | ICD-10-CM | POA: Diagnosis not present

## 2012-06-25 DIAGNOSIS — J9600 Acute respiratory failure, unspecified whether with hypoxia or hypercapnia: Secondary | ICD-10-CM | POA: Diagnosis not present

## 2012-06-26 DIAGNOSIS — E871 Hypo-osmolality and hyponatremia: Secondary | ICD-10-CM | POA: Diagnosis not present

## 2012-06-26 DIAGNOSIS — J180 Bronchopneumonia, unspecified organism: Secondary | ICD-10-CM | POA: Diagnosis not present

## 2012-06-26 DIAGNOSIS — I10 Essential (primary) hypertension: Secondary | ICD-10-CM | POA: Diagnosis not present

## 2012-06-26 DIAGNOSIS — J441 Chronic obstructive pulmonary disease with (acute) exacerbation: Secondary | ICD-10-CM | POA: Diagnosis not present

## 2012-06-27 DIAGNOSIS — J209 Acute bronchitis, unspecified: Secondary | ICD-10-CM | POA: Diagnosis not present

## 2012-06-27 DIAGNOSIS — E871 Hypo-osmolality and hyponatremia: Secondary | ICD-10-CM | POA: Diagnosis not present

## 2012-06-27 DIAGNOSIS — J441 Chronic obstructive pulmonary disease with (acute) exacerbation: Secondary | ICD-10-CM | POA: Diagnosis not present

## 2012-06-27 DIAGNOSIS — I10 Essential (primary) hypertension: Secondary | ICD-10-CM | POA: Diagnosis not present

## 2012-06-28 DIAGNOSIS — J441 Chronic obstructive pulmonary disease with (acute) exacerbation: Secondary | ICD-10-CM | POA: Diagnosis not present

## 2012-06-28 DIAGNOSIS — J449 Chronic obstructive pulmonary disease, unspecified: Secondary | ICD-10-CM | POA: Diagnosis not present

## 2012-06-28 DIAGNOSIS — I10 Essential (primary) hypertension: Secondary | ICD-10-CM | POA: Diagnosis not present

## 2012-06-28 DIAGNOSIS — E871 Hypo-osmolality and hyponatremia: Secondary | ICD-10-CM | POA: Diagnosis not present

## 2012-06-28 DIAGNOSIS — J209 Acute bronchitis, unspecified: Secondary | ICD-10-CM | POA: Diagnosis not present

## 2012-07-01 DIAGNOSIS — J40 Bronchitis, not specified as acute or chronic: Secondary | ICD-10-CM | POA: Diagnosis not present

## 2012-07-01 DIAGNOSIS — J449 Chronic obstructive pulmonary disease, unspecified: Secondary | ICD-10-CM | POA: Diagnosis not present

## 2012-08-05 DIAGNOSIS — Z23 Encounter for immunization: Secondary | ICD-10-CM | POA: Diagnosis not present

## 2012-08-05 DIAGNOSIS — J40 Bronchitis, not specified as acute or chronic: Secondary | ICD-10-CM | POA: Diagnosis not present

## 2012-09-29 DIAGNOSIS — R10813 Right lower quadrant abdominal tenderness: Secondary | ICD-10-CM | POA: Diagnosis not present

## 2012-09-29 DIAGNOSIS — J449 Chronic obstructive pulmonary disease, unspecified: Secondary | ICD-10-CM | POA: Diagnosis not present

## 2012-10-05 DIAGNOSIS — R10813 Right lower quadrant abdominal tenderness: Secondary | ICD-10-CM | POA: Diagnosis not present

## 2012-10-05 DIAGNOSIS — R161 Splenomegaly, not elsewhere classified: Secondary | ICD-10-CM | POA: Diagnosis not present

## 2012-10-08 ENCOUNTER — Other Ambulatory Visit: Payer: Self-pay

## 2012-10-08 MED ORDER — PREGABALIN 100 MG PO CAPS: 100.0000 mg | ORAL_CAPSULE | Freq: Two times a day (BID) | ORAL | Status: DC

## 2012-10-12 DIAGNOSIS — R161 Splenomegaly, not elsewhere classified: Secondary | ICD-10-CM | POA: Diagnosis not present

## 2012-10-12 DIAGNOSIS — R1031 Right lower quadrant pain: Secondary | ICD-10-CM | POA: Diagnosis not present

## 2012-10-15 DIAGNOSIS — R1031 Right lower quadrant pain: Secondary | ICD-10-CM | POA: Diagnosis not present

## 2012-10-15 DIAGNOSIS — R161 Splenomegaly, not elsewhere classified: Secondary | ICD-10-CM | POA: Diagnosis not present

## 2012-11-15 DIAGNOSIS — I369 Nonrheumatic tricuspid valve disorder, unspecified: Secondary | ICD-10-CM | POA: Diagnosis not present

## 2012-11-15 DIAGNOSIS — M129 Arthropathy, unspecified: Secondary | ICD-10-CM | POA: Diagnosis not present

## 2012-11-15 DIAGNOSIS — Z01818 Encounter for other preprocedural examination: Secondary | ICD-10-CM | POA: Diagnosis not present

## 2012-11-15 DIAGNOSIS — R9431 Abnormal electrocardiogram [ECG] [EKG]: Secondary | ICD-10-CM | POA: Diagnosis not present

## 2012-11-15 DIAGNOSIS — Z9981 Dependence on supplemental oxygen: Secondary | ICD-10-CM | POA: Diagnosis not present

## 2012-11-15 DIAGNOSIS — E039 Hypothyroidism, unspecified: Secondary | ICD-10-CM | POA: Diagnosis not present

## 2012-11-15 DIAGNOSIS — IMO0002 Reserved for concepts with insufficient information to code with codable children: Secondary | ICD-10-CM | POA: Diagnosis not present

## 2012-11-15 DIAGNOSIS — D7389 Other diseases of spleen: Secondary | ICD-10-CM | POA: Diagnosis not present

## 2012-11-15 DIAGNOSIS — J449 Chronic obstructive pulmonary disease, unspecified: Secondary | ICD-10-CM | POA: Diagnosis not present

## 2012-11-15 DIAGNOSIS — I359 Nonrheumatic aortic valve disorder, unspecified: Secondary | ICD-10-CM | POA: Diagnosis not present

## 2012-11-15 DIAGNOSIS — D649 Anemia, unspecified: Secondary | ICD-10-CM | POA: Diagnosis not present

## 2012-11-15 DIAGNOSIS — I10 Essential (primary) hypertension: Secondary | ICD-10-CM | POA: Diagnosis not present

## 2012-11-24 DIAGNOSIS — IMO0002 Reserved for concepts with insufficient information to code with codable children: Secondary | ICD-10-CM | POA: Diagnosis not present

## 2012-11-24 DIAGNOSIS — Z9981 Dependence on supplemental oxygen: Secondary | ICD-10-CM | POA: Diagnosis not present

## 2012-11-24 DIAGNOSIS — R161 Splenomegaly, not elsewhere classified: Secondary | ICD-10-CM | POA: Diagnosis not present

## 2012-11-24 DIAGNOSIS — D649 Anemia, unspecified: Secondary | ICD-10-CM | POA: Diagnosis present

## 2012-11-24 DIAGNOSIS — J441 Chronic obstructive pulmonary disease with (acute) exacerbation: Secondary | ICD-10-CM | POA: Diagnosis not present

## 2012-11-24 DIAGNOSIS — J4489 Other specified chronic obstructive pulmonary disease: Secondary | ICD-10-CM | POA: Diagnosis not present

## 2012-11-24 DIAGNOSIS — D739 Disease of spleen, unspecified: Secondary | ICD-10-CM | POA: Diagnosis not present

## 2012-11-24 DIAGNOSIS — E039 Hypothyroidism, unspecified: Secondary | ICD-10-CM | POA: Diagnosis present

## 2012-11-24 DIAGNOSIS — I10 Essential (primary) hypertension: Secondary | ICD-10-CM | POA: Diagnosis present

## 2012-11-24 DIAGNOSIS — D487 Neoplasm of uncertain behavior of other specified sites: Secondary | ICD-10-CM | POA: Diagnosis not present

## 2012-11-24 DIAGNOSIS — M129 Arthropathy, unspecified: Secondary | ICD-10-CM | POA: Diagnosis not present

## 2012-11-24 DIAGNOSIS — I9589 Other hypotension: Secondary | ICD-10-CM | POA: Diagnosis not present

## 2012-11-24 DIAGNOSIS — Z79899 Other long term (current) drug therapy: Secondary | ICD-10-CM | POA: Diagnosis not present

## 2012-11-24 DIAGNOSIS — J449 Chronic obstructive pulmonary disease, unspecified: Secondary | ICD-10-CM | POA: Diagnosis not present

## 2012-11-24 DIAGNOSIS — D7389 Other diseases of spleen: Secondary | ICD-10-CM | POA: Diagnosis not present

## 2012-11-24 DIAGNOSIS — Z7902 Long term (current) use of antithrombotics/antiplatelets: Secondary | ICD-10-CM | POA: Diagnosis not present

## 2012-11-24 DIAGNOSIS — J9600 Acute respiratory failure, unspecified whether with hypoxia or hypercapnia: Secondary | ICD-10-CM | POA: Diagnosis not present

## 2012-12-08 DIAGNOSIS — F411 Generalized anxiety disorder: Secondary | ICD-10-CM | POA: Diagnosis not present

## 2012-12-08 DIAGNOSIS — J449 Chronic obstructive pulmonary disease, unspecified: Secondary | ICD-10-CM | POA: Diagnosis not present

## 2012-12-21 DIAGNOSIS — H33009 Unspecified retinal detachment with retinal break, unspecified eye: Secondary | ICD-10-CM | POA: Diagnosis not present

## 2012-12-22 DIAGNOSIS — H3340 Traction detachment of retina, unspecified eye: Secondary | ICD-10-CM | POA: Diagnosis not present

## 2012-12-22 DIAGNOSIS — H4310 Vitreous hemorrhage, unspecified eye: Secondary | ICD-10-CM | POA: Diagnosis not present

## 2012-12-22 DIAGNOSIS — H33029 Retinal detachment with multiple breaks, unspecified eye: Secondary | ICD-10-CM | POA: Diagnosis not present

## 2012-12-22 DIAGNOSIS — H43819 Vitreous degeneration, unspecified eye: Secondary | ICD-10-CM | POA: Diagnosis not present

## 2013-01-26 DIAGNOSIS — J449 Chronic obstructive pulmonary disease, unspecified: Secondary | ICD-10-CM | POA: Diagnosis not present

## 2013-01-26 DIAGNOSIS — J029 Acute pharyngitis, unspecified: Secondary | ICD-10-CM | POA: Diagnosis not present

## 2013-02-08 DIAGNOSIS — H4310 Vitreous hemorrhage, unspecified eye: Secondary | ICD-10-CM | POA: Diagnosis not present

## 2013-02-08 DIAGNOSIS — H3340 Traction detachment of retina, unspecified eye: Secondary | ICD-10-CM | POA: Diagnosis not present

## 2013-02-08 DIAGNOSIS — H43819 Vitreous degeneration, unspecified eye: Secondary | ICD-10-CM | POA: Diagnosis not present

## 2013-02-08 DIAGNOSIS — H33029 Retinal detachment with multiple breaks, unspecified eye: Secondary | ICD-10-CM | POA: Diagnosis not present

## 2013-02-24 DIAGNOSIS — J449 Chronic obstructive pulmonary disease, unspecified: Secondary | ICD-10-CM | POA: Diagnosis not present

## 2013-03-01 DIAGNOSIS — R1013 Epigastric pain: Secondary | ICD-10-CM | POA: Diagnosis not present

## 2013-03-01 DIAGNOSIS — K432 Incisional hernia without obstruction or gangrene: Secondary | ICD-10-CM | POA: Diagnosis not present

## 2013-03-02 DIAGNOSIS — J40 Bronchitis, not specified as acute or chronic: Secondary | ICD-10-CM | POA: Diagnosis not present

## 2013-03-03 DIAGNOSIS — M47817 Spondylosis without myelopathy or radiculopathy, lumbosacral region: Secondary | ICD-10-CM | POA: Diagnosis not present

## 2013-03-03 DIAGNOSIS — I700 Atherosclerosis of aorta: Secondary | ICD-10-CM | POA: Diagnosis not present

## 2013-03-03 DIAGNOSIS — K432 Incisional hernia without obstruction or gangrene: Secondary | ICD-10-CM | POA: Diagnosis not present

## 2013-03-03 DIAGNOSIS — R1031 Right lower quadrant pain: Secondary | ICD-10-CM | POA: Diagnosis not present

## 2013-03-11 DIAGNOSIS — K432 Incisional hernia without obstruction or gangrene: Secondary | ICD-10-CM | POA: Diagnosis not present

## 2013-03-11 DIAGNOSIS — Z01812 Encounter for preprocedural laboratory examination: Secondary | ICD-10-CM | POA: Diagnosis not present

## 2013-03-16 DIAGNOSIS — Z7902 Long term (current) use of antithrombotics/antiplatelets: Secondary | ICD-10-CM | POA: Diagnosis not present

## 2013-03-16 DIAGNOSIS — E039 Hypothyroidism, unspecified: Secondary | ICD-10-CM | POA: Diagnosis not present

## 2013-03-16 DIAGNOSIS — IMO0002 Reserved for concepts with insufficient information to code with codable children: Secondary | ICD-10-CM | POA: Diagnosis not present

## 2013-03-16 DIAGNOSIS — I10 Essential (primary) hypertension: Secondary | ICD-10-CM | POA: Diagnosis not present

## 2013-03-16 DIAGNOSIS — K432 Incisional hernia without obstruction or gangrene: Secondary | ICD-10-CM | POA: Diagnosis not present

## 2013-03-16 DIAGNOSIS — J438 Other emphysema: Secondary | ICD-10-CM | POA: Diagnosis not present

## 2013-03-16 DIAGNOSIS — J449 Chronic obstructive pulmonary disease, unspecified: Secondary | ICD-10-CM | POA: Diagnosis not present

## 2013-03-17 DIAGNOSIS — Z7902 Long term (current) use of antithrombotics/antiplatelets: Secondary | ICD-10-CM | POA: Diagnosis not present

## 2013-03-17 DIAGNOSIS — K432 Incisional hernia without obstruction or gangrene: Secondary | ICD-10-CM | POA: Diagnosis not present

## 2013-03-17 DIAGNOSIS — E039 Hypothyroidism, unspecified: Secondary | ICD-10-CM | POA: Diagnosis not present

## 2013-03-17 DIAGNOSIS — I10 Essential (primary) hypertension: Secondary | ICD-10-CM | POA: Diagnosis not present

## 2013-03-17 DIAGNOSIS — J438 Other emphysema: Secondary | ICD-10-CM | POA: Diagnosis not present

## 2013-03-17 DIAGNOSIS — IMO0002 Reserved for concepts with insufficient information to code with codable children: Secondary | ICD-10-CM | POA: Diagnosis not present

## 2013-04-04 DIAGNOSIS — I10 Essential (primary) hypertension: Secondary | ICD-10-CM | POA: Diagnosis not present

## 2013-04-04 DIAGNOSIS — M7710 Lateral epicondylitis, unspecified elbow: Secondary | ICD-10-CM | POA: Diagnosis not present

## 2013-04-04 DIAGNOSIS — J449 Chronic obstructive pulmonary disease, unspecified: Secondary | ICD-10-CM | POA: Diagnosis not present

## 2013-04-08 ENCOUNTER — Other Ambulatory Visit: Payer: Self-pay

## 2013-04-08 DIAGNOSIS — H4310 Vitreous hemorrhage, unspecified eye: Secondary | ICD-10-CM | POA: Diagnosis not present

## 2013-04-08 DIAGNOSIS — H33029 Retinal detachment with multiple breaks, unspecified eye: Secondary | ICD-10-CM | POA: Diagnosis not present

## 2013-04-08 DIAGNOSIS — H3340 Traction detachment of retina, unspecified eye: Secondary | ICD-10-CM | POA: Diagnosis not present

## 2013-04-08 DIAGNOSIS — H43819 Vitreous degeneration, unspecified eye: Secondary | ICD-10-CM | POA: Diagnosis not present

## 2013-04-08 MED ORDER — PREGABALIN 100 MG PO CAPS: 100.0000 mg | ORAL_CAPSULE | Freq: Two times a day (BID) | ORAL | Status: DC

## 2013-04-08 NOTE — Telephone Encounter (Signed)
Rx signed and faxed.

## 2013-04-28 DIAGNOSIS — Z1231 Encounter for screening mammogram for malignant neoplasm of breast: Secondary | ICD-10-CM | POA: Diagnosis not present

## 2013-05-04 DIAGNOSIS — G5620 Lesion of ulnar nerve, unspecified upper limb: Secondary | ICD-10-CM | POA: Diagnosis not present

## 2013-05-12 DIAGNOSIS — J441 Chronic obstructive pulmonary disease with (acute) exacerbation: Secondary | ICD-10-CM | POA: Diagnosis not present

## 2013-06-20 DIAGNOSIS — G5620 Lesion of ulnar nerve, unspecified upper limb: Secondary | ICD-10-CM | POA: Diagnosis not present

## 2013-07-22 DIAGNOSIS — G5620 Lesion of ulnar nerve, unspecified upper limb: Secondary | ICD-10-CM | POA: Diagnosis not present

## 2013-08-01 DIAGNOSIS — J441 Chronic obstructive pulmonary disease with (acute) exacerbation: Secondary | ICD-10-CM | POA: Diagnosis not present

## 2013-08-03 DIAGNOSIS — G5620 Lesion of ulnar nerve, unspecified upper limb: Secondary | ICD-10-CM | POA: Diagnosis not present

## 2013-08-04 DIAGNOSIS — J441 Chronic obstructive pulmonary disease with (acute) exacerbation: Secondary | ICD-10-CM | POA: Diagnosis not present

## 2013-08-05 DIAGNOSIS — J209 Acute bronchitis, unspecified: Secondary | ICD-10-CM | POA: Diagnosis not present

## 2013-08-12 DIAGNOSIS — H33059 Total retinal detachment, unspecified eye: Secondary | ICD-10-CM | POA: Diagnosis not present

## 2013-08-12 DIAGNOSIS — H3340 Traction detachment of retina, unspecified eye: Secondary | ICD-10-CM | POA: Diagnosis not present

## 2013-08-16 DIAGNOSIS — G8918 Other acute postprocedural pain: Secondary | ICD-10-CM | POA: Diagnosis not present

## 2013-08-16 DIAGNOSIS — G5620 Lesion of ulnar nerve, unspecified upper limb: Secondary | ICD-10-CM | POA: Diagnosis not present

## 2013-08-18 DIAGNOSIS — G5620 Lesion of ulnar nerve, unspecified upper limb: Secondary | ICD-10-CM | POA: Diagnosis not present

## 2013-08-31 DIAGNOSIS — J209 Acute bronchitis, unspecified: Secondary | ICD-10-CM | POA: Diagnosis not present

## 2013-08-31 DIAGNOSIS — J40 Bronchitis, not specified as acute or chronic: Secondary | ICD-10-CM | POA: Diagnosis not present

## 2013-08-31 DIAGNOSIS — R918 Other nonspecific abnormal finding of lung field: Secondary | ICD-10-CM | POA: Diagnosis not present

## 2013-09-21 DIAGNOSIS — G4710 Hypersomnia, unspecified: Secondary | ICD-10-CM | POA: Diagnosis not present

## 2013-09-21 DIAGNOSIS — G4730 Sleep apnea, unspecified: Secondary | ICD-10-CM | POA: Diagnosis not present

## 2013-09-21 DIAGNOSIS — E559 Vitamin D deficiency, unspecified: Secondary | ICD-10-CM | POA: Diagnosis not present

## 2013-09-21 DIAGNOSIS — J449 Chronic obstructive pulmonary disease, unspecified: Secondary | ICD-10-CM | POA: Diagnosis not present

## 2013-09-21 DIAGNOSIS — R5381 Other malaise: Secondary | ICD-10-CM | POA: Diagnosis not present

## 2013-09-21 DIAGNOSIS — J3089 Other allergic rhinitis: Secondary | ICD-10-CM | POA: Diagnosis not present

## 2013-09-21 DIAGNOSIS — R5383 Other fatigue: Secondary | ICD-10-CM | POA: Diagnosis not present

## 2013-09-23 DIAGNOSIS — F3289 Other specified depressive episodes: Secondary | ICD-10-CM | POA: Diagnosis not present

## 2013-09-23 DIAGNOSIS — Z8701 Personal history of pneumonia (recurrent): Secondary | ICD-10-CM | POA: Diagnosis not present

## 2013-09-23 DIAGNOSIS — M1990 Unspecified osteoarthritis, unspecified site: Secondary | ICD-10-CM | POA: Diagnosis not present

## 2013-09-23 DIAGNOSIS — G9332 Myalgic encephalomyelitis/chronic fatigue syndrome: Secondary | ICD-10-CM | POA: Diagnosis not present

## 2013-09-23 DIAGNOSIS — G4710 Hypersomnia, unspecified: Secondary | ICD-10-CM | POA: Diagnosis not present

## 2013-09-23 DIAGNOSIS — Z9981 Dependence on supplemental oxygen: Secondary | ICD-10-CM | POA: Diagnosis not present

## 2013-09-23 DIAGNOSIS — G8929 Other chronic pain: Secondary | ICD-10-CM | POA: Diagnosis not present

## 2013-09-23 DIAGNOSIS — H548 Legal blindness, as defined in USA: Secondary | ICD-10-CM | POA: Diagnosis not present

## 2013-09-23 DIAGNOSIS — Z9181 History of falling: Secondary | ICD-10-CM | POA: Diagnosis not present

## 2013-09-23 DIAGNOSIS — F329 Major depressive disorder, single episode, unspecified: Secondary | ICD-10-CM | POA: Diagnosis not present

## 2013-09-23 DIAGNOSIS — F411 Generalized anxiety disorder: Secondary | ICD-10-CM | POA: Diagnosis not present

## 2013-09-23 DIAGNOSIS — I10 Essential (primary) hypertension: Secondary | ICD-10-CM | POA: Diagnosis not present

## 2013-09-23 DIAGNOSIS — R5382 Chronic fatigue, unspecified: Secondary | ICD-10-CM | POA: Diagnosis not present

## 2013-09-23 DIAGNOSIS — J449 Chronic obstructive pulmonary disease, unspecified: Secondary | ICD-10-CM | POA: Diagnosis not present

## 2013-09-23 DIAGNOSIS — Z5189 Encounter for other specified aftercare: Secondary | ICD-10-CM | POA: Diagnosis not present

## 2013-09-23 DIAGNOSIS — G4730 Sleep apnea, unspecified: Secondary | ICD-10-CM | POA: Diagnosis not present

## 2013-09-23 DIAGNOSIS — Z8673 Personal history of transient ischemic attack (TIA), and cerebral infarction without residual deficits: Secondary | ICD-10-CM | POA: Diagnosis not present

## 2013-09-27 DIAGNOSIS — Z5189 Encounter for other specified aftercare: Secondary | ICD-10-CM | POA: Diagnosis not present

## 2013-09-27 DIAGNOSIS — F329 Major depressive disorder, single episode, unspecified: Secondary | ICD-10-CM | POA: Diagnosis not present

## 2013-09-27 DIAGNOSIS — R5382 Chronic fatigue, unspecified: Secondary | ICD-10-CM | POA: Diagnosis not present

## 2013-09-27 DIAGNOSIS — G9332 Myalgic encephalomyelitis/chronic fatigue syndrome: Secondary | ICD-10-CM | POA: Diagnosis not present

## 2013-09-27 DIAGNOSIS — H548 Legal blindness, as defined in USA: Secondary | ICD-10-CM | POA: Diagnosis not present

## 2013-09-27 DIAGNOSIS — F3289 Other specified depressive episodes: Secondary | ICD-10-CM | POA: Diagnosis not present

## 2013-09-27 DIAGNOSIS — F411 Generalized anxiety disorder: Secondary | ICD-10-CM | POA: Diagnosis not present

## 2013-09-27 DIAGNOSIS — J449 Chronic obstructive pulmonary disease, unspecified: Secondary | ICD-10-CM | POA: Diagnosis not present

## 2013-09-28 DIAGNOSIS — G4730 Sleep apnea, unspecified: Secondary | ICD-10-CM | POA: Diagnosis not present

## 2013-09-28 DIAGNOSIS — G9332 Myalgic encephalomyelitis/chronic fatigue syndrome: Secondary | ICD-10-CM | POA: Diagnosis not present

## 2013-09-28 DIAGNOSIS — J449 Chronic obstructive pulmonary disease, unspecified: Secondary | ICD-10-CM | POA: Diagnosis not present

## 2013-09-28 DIAGNOSIS — F3289 Other specified depressive episodes: Secondary | ICD-10-CM | POA: Diagnosis not present

## 2013-09-28 DIAGNOSIS — R5382 Chronic fatigue, unspecified: Secondary | ICD-10-CM | POA: Diagnosis not present

## 2013-09-28 DIAGNOSIS — F329 Major depressive disorder, single episode, unspecified: Secondary | ICD-10-CM | POA: Diagnosis not present

## 2013-09-28 DIAGNOSIS — G4710 Hypersomnia, unspecified: Secondary | ICD-10-CM | POA: Diagnosis not present

## 2013-09-28 DIAGNOSIS — F411 Generalized anxiety disorder: Secondary | ICD-10-CM | POA: Diagnosis not present

## 2013-09-28 DIAGNOSIS — Z5189 Encounter for other specified aftercare: Secondary | ICD-10-CM | POA: Diagnosis not present

## 2013-09-28 DIAGNOSIS — H548 Legal blindness, as defined in USA: Secondary | ICD-10-CM | POA: Diagnosis not present

## 2013-09-30 DIAGNOSIS — R5382 Chronic fatigue, unspecified: Secondary | ICD-10-CM | POA: Diagnosis not present

## 2013-09-30 DIAGNOSIS — J449 Chronic obstructive pulmonary disease, unspecified: Secondary | ICD-10-CM | POA: Diagnosis not present

## 2013-09-30 DIAGNOSIS — G9332 Myalgic encephalomyelitis/chronic fatigue syndrome: Secondary | ICD-10-CM | POA: Diagnosis not present

## 2013-09-30 DIAGNOSIS — H548 Legal blindness, as defined in USA: Secondary | ICD-10-CM | POA: Diagnosis not present

## 2013-09-30 DIAGNOSIS — Z5189 Encounter for other specified aftercare: Secondary | ICD-10-CM | POA: Diagnosis not present

## 2013-09-30 DIAGNOSIS — F3289 Other specified depressive episodes: Secondary | ICD-10-CM | POA: Diagnosis not present

## 2013-09-30 DIAGNOSIS — F329 Major depressive disorder, single episode, unspecified: Secondary | ICD-10-CM | POA: Diagnosis not present

## 2013-09-30 DIAGNOSIS — F411 Generalized anxiety disorder: Secondary | ICD-10-CM | POA: Diagnosis not present

## 2013-10-04 DIAGNOSIS — J449 Chronic obstructive pulmonary disease, unspecified: Secondary | ICD-10-CM | POA: Diagnosis not present

## 2013-10-04 DIAGNOSIS — G9332 Myalgic encephalomyelitis/chronic fatigue syndrome: Secondary | ICD-10-CM | POA: Diagnosis not present

## 2013-10-04 DIAGNOSIS — H548 Legal blindness, as defined in USA: Secondary | ICD-10-CM | POA: Diagnosis not present

## 2013-10-04 DIAGNOSIS — F329 Major depressive disorder, single episode, unspecified: Secondary | ICD-10-CM | POA: Diagnosis not present

## 2013-10-04 DIAGNOSIS — F3289 Other specified depressive episodes: Secondary | ICD-10-CM | POA: Diagnosis not present

## 2013-10-04 DIAGNOSIS — R5382 Chronic fatigue, unspecified: Secondary | ICD-10-CM | POA: Diagnosis not present

## 2013-10-04 DIAGNOSIS — Z5189 Encounter for other specified aftercare: Secondary | ICD-10-CM | POA: Diagnosis not present

## 2013-10-04 DIAGNOSIS — F411 Generalized anxiety disorder: Secondary | ICD-10-CM | POA: Diagnosis not present

## 2013-10-07 DIAGNOSIS — H548 Legal blindness, as defined in USA: Secondary | ICD-10-CM | POA: Diagnosis not present

## 2013-10-07 DIAGNOSIS — J449 Chronic obstructive pulmonary disease, unspecified: Secondary | ICD-10-CM | POA: Diagnosis not present

## 2013-10-07 DIAGNOSIS — F3289 Other specified depressive episodes: Secondary | ICD-10-CM | POA: Diagnosis not present

## 2013-10-07 DIAGNOSIS — Z5189 Encounter for other specified aftercare: Secondary | ICD-10-CM | POA: Diagnosis not present

## 2013-10-07 DIAGNOSIS — R5382 Chronic fatigue, unspecified: Secondary | ICD-10-CM | POA: Diagnosis not present

## 2013-10-07 DIAGNOSIS — G9332 Myalgic encephalomyelitis/chronic fatigue syndrome: Secondary | ICD-10-CM | POA: Diagnosis not present

## 2013-10-07 DIAGNOSIS — F329 Major depressive disorder, single episode, unspecified: Secondary | ICD-10-CM | POA: Diagnosis not present

## 2013-10-07 DIAGNOSIS — F411 Generalized anxiety disorder: Secondary | ICD-10-CM | POA: Diagnosis not present

## 2013-10-10 DIAGNOSIS — F411 Generalized anxiety disorder: Secondary | ICD-10-CM | POA: Diagnosis not present

## 2013-10-10 DIAGNOSIS — H548 Legal blindness, as defined in USA: Secondary | ICD-10-CM | POA: Diagnosis not present

## 2013-10-10 DIAGNOSIS — R5382 Chronic fatigue, unspecified: Secondary | ICD-10-CM | POA: Diagnosis not present

## 2013-10-10 DIAGNOSIS — J449 Chronic obstructive pulmonary disease, unspecified: Secondary | ICD-10-CM | POA: Diagnosis not present

## 2013-10-10 DIAGNOSIS — G9332 Myalgic encephalomyelitis/chronic fatigue syndrome: Secondary | ICD-10-CM | POA: Diagnosis not present

## 2013-10-10 DIAGNOSIS — F329 Major depressive disorder, single episode, unspecified: Secondary | ICD-10-CM | POA: Diagnosis not present

## 2013-10-10 DIAGNOSIS — F3289 Other specified depressive episodes: Secondary | ICD-10-CM | POA: Diagnosis not present

## 2013-10-10 DIAGNOSIS — Z5189 Encounter for other specified aftercare: Secondary | ICD-10-CM | POA: Diagnosis not present

## 2013-10-11 DIAGNOSIS — G9332 Myalgic encephalomyelitis/chronic fatigue syndrome: Secondary | ICD-10-CM | POA: Diagnosis not present

## 2013-10-11 DIAGNOSIS — J449 Chronic obstructive pulmonary disease, unspecified: Secondary | ICD-10-CM | POA: Diagnosis not present

## 2013-10-11 DIAGNOSIS — F411 Generalized anxiety disorder: Secondary | ICD-10-CM | POA: Diagnosis not present

## 2013-10-11 DIAGNOSIS — F3289 Other specified depressive episodes: Secondary | ICD-10-CM | POA: Diagnosis not present

## 2013-10-11 DIAGNOSIS — S93419A Sprain of calcaneofibular ligament of unspecified ankle, initial encounter: Secondary | ICD-10-CM | POA: Diagnosis not present

## 2013-10-11 DIAGNOSIS — Z5189 Encounter for other specified aftercare: Secondary | ICD-10-CM | POA: Diagnosis not present

## 2013-10-11 DIAGNOSIS — F329 Major depressive disorder, single episode, unspecified: Secondary | ICD-10-CM | POA: Diagnosis not present

## 2013-10-11 DIAGNOSIS — R5382 Chronic fatigue, unspecified: Secondary | ICD-10-CM | POA: Diagnosis not present

## 2013-10-11 DIAGNOSIS — H548 Legal blindness, as defined in USA: Secondary | ICD-10-CM | POA: Diagnosis not present

## 2013-10-12 DIAGNOSIS — R5382 Chronic fatigue, unspecified: Secondary | ICD-10-CM | POA: Diagnosis not present

## 2013-10-12 DIAGNOSIS — Z5189 Encounter for other specified aftercare: Secondary | ICD-10-CM | POA: Diagnosis not present

## 2013-10-12 DIAGNOSIS — F3289 Other specified depressive episodes: Secondary | ICD-10-CM | POA: Diagnosis not present

## 2013-10-12 DIAGNOSIS — G9332 Myalgic encephalomyelitis/chronic fatigue syndrome: Secondary | ICD-10-CM | POA: Diagnosis not present

## 2013-10-12 DIAGNOSIS — J449 Chronic obstructive pulmonary disease, unspecified: Secondary | ICD-10-CM | POA: Diagnosis not present

## 2013-10-12 DIAGNOSIS — F329 Major depressive disorder, single episode, unspecified: Secondary | ICD-10-CM | POA: Diagnosis not present

## 2013-10-12 DIAGNOSIS — F411 Generalized anxiety disorder: Secondary | ICD-10-CM | POA: Diagnosis not present

## 2013-10-12 DIAGNOSIS — H548 Legal blindness, as defined in USA: Secondary | ICD-10-CM | POA: Diagnosis not present

## 2013-10-13 DIAGNOSIS — F3289 Other specified depressive episodes: Secondary | ICD-10-CM | POA: Diagnosis not present

## 2013-10-13 DIAGNOSIS — F411 Generalized anxiety disorder: Secondary | ICD-10-CM | POA: Diagnosis not present

## 2013-10-13 DIAGNOSIS — R5382 Chronic fatigue, unspecified: Secondary | ICD-10-CM | POA: Diagnosis not present

## 2013-10-13 DIAGNOSIS — H548 Legal blindness, as defined in USA: Secondary | ICD-10-CM | POA: Diagnosis not present

## 2013-10-13 DIAGNOSIS — F329 Major depressive disorder, single episode, unspecified: Secondary | ICD-10-CM | POA: Diagnosis not present

## 2013-10-13 DIAGNOSIS — Z5189 Encounter for other specified aftercare: Secondary | ICD-10-CM | POA: Diagnosis not present

## 2013-10-13 DIAGNOSIS — G9332 Myalgic encephalomyelitis/chronic fatigue syndrome: Secondary | ICD-10-CM | POA: Diagnosis not present

## 2013-10-13 DIAGNOSIS — J449 Chronic obstructive pulmonary disease, unspecified: Secondary | ICD-10-CM | POA: Diagnosis not present

## 2013-10-14 DIAGNOSIS — F329 Major depressive disorder, single episode, unspecified: Secondary | ICD-10-CM | POA: Diagnosis not present

## 2013-10-14 DIAGNOSIS — G9332 Myalgic encephalomyelitis/chronic fatigue syndrome: Secondary | ICD-10-CM | POA: Diagnosis not present

## 2013-10-14 DIAGNOSIS — F3289 Other specified depressive episodes: Secondary | ICD-10-CM | POA: Diagnosis not present

## 2013-10-14 DIAGNOSIS — F411 Generalized anxiety disorder: Secondary | ICD-10-CM | POA: Diagnosis not present

## 2013-10-14 DIAGNOSIS — J449 Chronic obstructive pulmonary disease, unspecified: Secondary | ICD-10-CM | POA: Diagnosis not present

## 2013-10-14 DIAGNOSIS — R5382 Chronic fatigue, unspecified: Secondary | ICD-10-CM | POA: Diagnosis not present

## 2013-10-14 DIAGNOSIS — H548 Legal blindness, as defined in USA: Secondary | ICD-10-CM | POA: Diagnosis not present

## 2013-10-14 DIAGNOSIS — Z5189 Encounter for other specified aftercare: Secondary | ICD-10-CM | POA: Diagnosis not present

## 2013-10-18 DIAGNOSIS — F411 Generalized anxiety disorder: Secondary | ICD-10-CM | POA: Diagnosis not present

## 2013-10-18 DIAGNOSIS — F329 Major depressive disorder, single episode, unspecified: Secondary | ICD-10-CM | POA: Diagnosis not present

## 2013-10-18 DIAGNOSIS — G9332 Myalgic encephalomyelitis/chronic fatigue syndrome: Secondary | ICD-10-CM | POA: Diagnosis not present

## 2013-10-18 DIAGNOSIS — Z5189 Encounter for other specified aftercare: Secondary | ICD-10-CM | POA: Diagnosis not present

## 2013-10-18 DIAGNOSIS — H548 Legal blindness, as defined in USA: Secondary | ICD-10-CM | POA: Diagnosis not present

## 2013-10-18 DIAGNOSIS — R5382 Chronic fatigue, unspecified: Secondary | ICD-10-CM | POA: Diagnosis not present

## 2013-10-18 DIAGNOSIS — F3289 Other specified depressive episodes: Secondary | ICD-10-CM | POA: Diagnosis not present

## 2013-10-18 DIAGNOSIS — J449 Chronic obstructive pulmonary disease, unspecified: Secondary | ICD-10-CM | POA: Diagnosis not present

## 2013-10-19 DIAGNOSIS — F3289 Other specified depressive episodes: Secondary | ICD-10-CM | POA: Diagnosis not present

## 2013-10-19 DIAGNOSIS — F329 Major depressive disorder, single episode, unspecified: Secondary | ICD-10-CM | POA: Diagnosis not present

## 2013-10-19 DIAGNOSIS — Z5189 Encounter for other specified aftercare: Secondary | ICD-10-CM | POA: Diagnosis not present

## 2013-10-19 DIAGNOSIS — G4730 Sleep apnea, unspecified: Secondary | ICD-10-CM | POA: Diagnosis not present

## 2013-10-19 DIAGNOSIS — H548 Legal blindness, as defined in USA: Secondary | ICD-10-CM | POA: Diagnosis not present

## 2013-10-19 DIAGNOSIS — G9332 Myalgic encephalomyelitis/chronic fatigue syndrome: Secondary | ICD-10-CM | POA: Diagnosis not present

## 2013-10-19 DIAGNOSIS — G4710 Hypersomnia, unspecified: Secondary | ICD-10-CM | POA: Diagnosis not present

## 2013-10-19 DIAGNOSIS — J449 Chronic obstructive pulmonary disease, unspecified: Secondary | ICD-10-CM | POA: Diagnosis not present

## 2013-10-19 DIAGNOSIS — J3089 Other allergic rhinitis: Secondary | ICD-10-CM | POA: Diagnosis not present

## 2013-10-19 DIAGNOSIS — F411 Generalized anxiety disorder: Secondary | ICD-10-CM | POA: Diagnosis not present

## 2013-10-19 DIAGNOSIS — R5382 Chronic fatigue, unspecified: Secondary | ICD-10-CM | POA: Diagnosis not present

## 2013-10-20 DIAGNOSIS — J449 Chronic obstructive pulmonary disease, unspecified: Secondary | ICD-10-CM | POA: Diagnosis not present

## 2013-10-20 DIAGNOSIS — G9332 Myalgic encephalomyelitis/chronic fatigue syndrome: Secondary | ICD-10-CM | POA: Diagnosis not present

## 2013-10-20 DIAGNOSIS — H548 Legal blindness, as defined in USA: Secondary | ICD-10-CM | POA: Diagnosis not present

## 2013-10-20 DIAGNOSIS — F411 Generalized anxiety disorder: Secondary | ICD-10-CM | POA: Diagnosis not present

## 2013-10-20 DIAGNOSIS — Z5189 Encounter for other specified aftercare: Secondary | ICD-10-CM | POA: Diagnosis not present

## 2013-10-20 DIAGNOSIS — F3289 Other specified depressive episodes: Secondary | ICD-10-CM | POA: Diagnosis not present

## 2013-10-20 DIAGNOSIS — R5382 Chronic fatigue, unspecified: Secondary | ICD-10-CM | POA: Diagnosis not present

## 2013-10-20 DIAGNOSIS — F329 Major depressive disorder, single episode, unspecified: Secondary | ICD-10-CM | POA: Diagnosis not present

## 2013-10-21 DIAGNOSIS — F329 Major depressive disorder, single episode, unspecified: Secondary | ICD-10-CM | POA: Diagnosis not present

## 2013-10-21 DIAGNOSIS — Z5189 Encounter for other specified aftercare: Secondary | ICD-10-CM | POA: Diagnosis not present

## 2013-10-21 DIAGNOSIS — F411 Generalized anxiety disorder: Secondary | ICD-10-CM | POA: Diagnosis not present

## 2013-10-21 DIAGNOSIS — J449 Chronic obstructive pulmonary disease, unspecified: Secondary | ICD-10-CM | POA: Diagnosis not present

## 2013-10-21 DIAGNOSIS — R5382 Chronic fatigue, unspecified: Secondary | ICD-10-CM | POA: Diagnosis not present

## 2013-10-21 DIAGNOSIS — F3289 Other specified depressive episodes: Secondary | ICD-10-CM | POA: Diagnosis not present

## 2013-10-21 DIAGNOSIS — H548 Legal blindness, as defined in USA: Secondary | ICD-10-CM | POA: Diagnosis not present

## 2013-10-21 DIAGNOSIS — G9332 Myalgic encephalomyelitis/chronic fatigue syndrome: Secondary | ICD-10-CM | POA: Diagnosis not present

## 2013-10-25 DIAGNOSIS — R918 Other nonspecific abnormal finding of lung field: Secondary | ICD-10-CM | POA: Diagnosis not present

## 2013-10-25 DIAGNOSIS — R0602 Shortness of breath: Secondary | ICD-10-CM | POA: Diagnosis not present

## 2013-10-25 DIAGNOSIS — J3089 Other allergic rhinitis: Secondary | ICD-10-CM | POA: Diagnosis not present

## 2013-10-25 DIAGNOSIS — J449 Chronic obstructive pulmonary disease, unspecified: Secondary | ICD-10-CM | POA: Diagnosis not present

## 2013-10-25 DIAGNOSIS — F411 Generalized anxiety disorder: Secondary | ICD-10-CM | POA: Diagnosis not present

## 2013-10-25 DIAGNOSIS — G4710 Hypersomnia, unspecified: Secondary | ICD-10-CM | POA: Diagnosis not present

## 2013-10-25 DIAGNOSIS — G4730 Sleep apnea, unspecified: Secondary | ICD-10-CM | POA: Diagnosis not present

## 2013-10-27 DIAGNOSIS — F411 Generalized anxiety disorder: Secondary | ICD-10-CM | POA: Diagnosis not present

## 2013-10-27 DIAGNOSIS — F329 Major depressive disorder, single episode, unspecified: Secondary | ICD-10-CM | POA: Diagnosis not present

## 2013-10-27 DIAGNOSIS — F3289 Other specified depressive episodes: Secondary | ICD-10-CM | POA: Diagnosis not present

## 2013-10-27 DIAGNOSIS — H548 Legal blindness, as defined in USA: Secondary | ICD-10-CM | POA: Diagnosis not present

## 2013-10-27 DIAGNOSIS — J449 Chronic obstructive pulmonary disease, unspecified: Secondary | ICD-10-CM | POA: Diagnosis not present

## 2013-10-27 DIAGNOSIS — Z5189 Encounter for other specified aftercare: Secondary | ICD-10-CM | POA: Diagnosis not present

## 2013-10-27 DIAGNOSIS — G9332 Myalgic encephalomyelitis/chronic fatigue syndrome: Secondary | ICD-10-CM | POA: Diagnosis not present

## 2013-10-27 DIAGNOSIS — R5382 Chronic fatigue, unspecified: Secondary | ICD-10-CM | POA: Diagnosis not present

## 2013-10-31 DIAGNOSIS — G9332 Myalgic encephalomyelitis/chronic fatigue syndrome: Secondary | ICD-10-CM | POA: Diagnosis not present

## 2013-10-31 DIAGNOSIS — Z5189 Encounter for other specified aftercare: Secondary | ICD-10-CM | POA: Diagnosis not present

## 2013-10-31 DIAGNOSIS — J449 Chronic obstructive pulmonary disease, unspecified: Secondary | ICD-10-CM | POA: Diagnosis not present

## 2013-10-31 DIAGNOSIS — F3289 Other specified depressive episodes: Secondary | ICD-10-CM | POA: Diagnosis not present

## 2013-10-31 DIAGNOSIS — F329 Major depressive disorder, single episode, unspecified: Secondary | ICD-10-CM | POA: Diagnosis not present

## 2013-10-31 DIAGNOSIS — R5382 Chronic fatigue, unspecified: Secondary | ICD-10-CM | POA: Diagnosis not present

## 2013-10-31 DIAGNOSIS — H548 Legal blindness, as defined in USA: Secondary | ICD-10-CM | POA: Diagnosis not present

## 2013-10-31 DIAGNOSIS — F411 Generalized anxiety disorder: Secondary | ICD-10-CM | POA: Diagnosis not present

## 2013-11-01 DIAGNOSIS — H548 Legal blindness, as defined in USA: Secondary | ICD-10-CM | POA: Diagnosis not present

## 2013-11-01 DIAGNOSIS — G9332 Myalgic encephalomyelitis/chronic fatigue syndrome: Secondary | ICD-10-CM | POA: Diagnosis not present

## 2013-11-01 DIAGNOSIS — F329 Major depressive disorder, single episode, unspecified: Secondary | ICD-10-CM | POA: Diagnosis not present

## 2013-11-01 DIAGNOSIS — J449 Chronic obstructive pulmonary disease, unspecified: Secondary | ICD-10-CM | POA: Diagnosis not present

## 2013-11-01 DIAGNOSIS — F411 Generalized anxiety disorder: Secondary | ICD-10-CM | POA: Diagnosis not present

## 2013-11-01 DIAGNOSIS — M25579 Pain in unspecified ankle and joints of unspecified foot: Secondary | ICD-10-CM | POA: Diagnosis not present

## 2013-11-01 DIAGNOSIS — F3289 Other specified depressive episodes: Secondary | ICD-10-CM | POA: Diagnosis not present

## 2013-11-01 DIAGNOSIS — Z5189 Encounter for other specified aftercare: Secondary | ICD-10-CM | POA: Diagnosis not present

## 2013-11-01 DIAGNOSIS — R5382 Chronic fatigue, unspecified: Secondary | ICD-10-CM | POA: Diagnosis not present

## 2013-11-04 DIAGNOSIS — F411 Generalized anxiety disorder: Secondary | ICD-10-CM | POA: Diagnosis not present

## 2013-11-04 DIAGNOSIS — Z5189 Encounter for other specified aftercare: Secondary | ICD-10-CM | POA: Diagnosis not present

## 2013-11-04 DIAGNOSIS — R5382 Chronic fatigue, unspecified: Secondary | ICD-10-CM | POA: Diagnosis not present

## 2013-11-04 DIAGNOSIS — H548 Legal blindness, as defined in USA: Secondary | ICD-10-CM | POA: Diagnosis not present

## 2013-11-04 DIAGNOSIS — J449 Chronic obstructive pulmonary disease, unspecified: Secondary | ICD-10-CM | POA: Diagnosis not present

## 2013-11-04 DIAGNOSIS — F329 Major depressive disorder, single episode, unspecified: Secondary | ICD-10-CM | POA: Diagnosis not present

## 2013-11-04 DIAGNOSIS — F3289 Other specified depressive episodes: Secondary | ICD-10-CM | POA: Diagnosis not present

## 2013-11-04 DIAGNOSIS — G9332 Myalgic encephalomyelitis/chronic fatigue syndrome: Secondary | ICD-10-CM | POA: Diagnosis not present

## 2013-11-08 DIAGNOSIS — G9332 Myalgic encephalomyelitis/chronic fatigue syndrome: Secondary | ICD-10-CM | POA: Diagnosis not present

## 2013-11-08 DIAGNOSIS — J449 Chronic obstructive pulmonary disease, unspecified: Secondary | ICD-10-CM | POA: Diagnosis not present

## 2013-11-08 DIAGNOSIS — F411 Generalized anxiety disorder: Secondary | ICD-10-CM | POA: Diagnosis not present

## 2013-11-08 DIAGNOSIS — H548 Legal blindness, as defined in USA: Secondary | ICD-10-CM | POA: Diagnosis not present

## 2013-11-08 DIAGNOSIS — R5382 Chronic fatigue, unspecified: Secondary | ICD-10-CM | POA: Diagnosis not present

## 2013-11-08 DIAGNOSIS — Z5189 Encounter for other specified aftercare: Secondary | ICD-10-CM | POA: Diagnosis not present

## 2013-11-08 DIAGNOSIS — F3289 Other specified depressive episodes: Secondary | ICD-10-CM | POA: Diagnosis not present

## 2013-11-08 DIAGNOSIS — F329 Major depressive disorder, single episode, unspecified: Secondary | ICD-10-CM | POA: Diagnosis not present

## 2013-11-09 DIAGNOSIS — R262 Difficulty in walking, not elsewhere classified: Secondary | ICD-10-CM | POA: Diagnosis not present

## 2013-11-09 DIAGNOSIS — S93409A Sprain of unspecified ligament of unspecified ankle, initial encounter: Secondary | ICD-10-CM | POA: Diagnosis not present

## 2013-11-11 DIAGNOSIS — R5383 Other fatigue: Secondary | ICD-10-CM | POA: Diagnosis not present

## 2013-11-11 DIAGNOSIS — R5381 Other malaise: Secondary | ICD-10-CM | POA: Diagnosis not present

## 2013-11-11 DIAGNOSIS — J3089 Other allergic rhinitis: Secondary | ICD-10-CM | POA: Diagnosis not present

## 2013-11-11 DIAGNOSIS — F411 Generalized anxiety disorder: Secondary | ICD-10-CM | POA: Diagnosis not present

## 2013-11-11 DIAGNOSIS — J449 Chronic obstructive pulmonary disease, unspecified: Secondary | ICD-10-CM | POA: Diagnosis not present

## 2013-11-14 DIAGNOSIS — R262 Difficulty in walking, not elsewhere classified: Secondary | ICD-10-CM | POA: Diagnosis not present

## 2013-11-14 DIAGNOSIS — S93409A Sprain of unspecified ligament of unspecified ankle, initial encounter: Secondary | ICD-10-CM | POA: Diagnosis not present

## 2013-11-21 DIAGNOSIS — S93409A Sprain of unspecified ligament of unspecified ankle, initial encounter: Secondary | ICD-10-CM | POA: Diagnosis not present

## 2013-11-21 DIAGNOSIS — R262 Difficulty in walking, not elsewhere classified: Secondary | ICD-10-CM | POA: Diagnosis not present

## 2013-11-24 DIAGNOSIS — J309 Allergic rhinitis, unspecified: Secondary | ICD-10-CM | POA: Diagnosis not present

## 2013-11-24 DIAGNOSIS — J3089 Other allergic rhinitis: Secondary | ICD-10-CM | POA: Diagnosis not present

## 2013-11-25 DIAGNOSIS — S93409A Sprain of unspecified ligament of unspecified ankle, initial encounter: Secondary | ICD-10-CM | POA: Diagnosis not present

## 2013-11-25 DIAGNOSIS — R262 Difficulty in walking, not elsewhere classified: Secondary | ICD-10-CM | POA: Diagnosis not present

## 2013-11-29 DIAGNOSIS — S93409A Sprain of unspecified ligament of unspecified ankle, initial encounter: Secondary | ICD-10-CM | POA: Diagnosis not present

## 2013-11-29 DIAGNOSIS — R262 Difficulty in walking, not elsewhere classified: Secondary | ICD-10-CM | POA: Diagnosis not present

## 2013-11-30 DIAGNOSIS — M25579 Pain in unspecified ankle and joints of unspecified foot: Secondary | ICD-10-CM | POA: Diagnosis not present

## 2013-12-01 DIAGNOSIS — R262 Difficulty in walking, not elsewhere classified: Secondary | ICD-10-CM | POA: Diagnosis not present

## 2013-12-01 DIAGNOSIS — S93409A Sprain of unspecified ligament of unspecified ankle, initial encounter: Secondary | ICD-10-CM | POA: Diagnosis not present

## 2013-12-06 DIAGNOSIS — R262 Difficulty in walking, not elsewhere classified: Secondary | ICD-10-CM | POA: Diagnosis not present

## 2013-12-06 DIAGNOSIS — S93409A Sprain of unspecified ligament of unspecified ankle, initial encounter: Secondary | ICD-10-CM | POA: Diagnosis not present

## 2013-12-08 DIAGNOSIS — S8990XA Unspecified injury of unspecified lower leg, initial encounter: Secondary | ICD-10-CM | POA: Diagnosis not present

## 2013-12-08 DIAGNOSIS — J310 Chronic rhinitis: Secondary | ICD-10-CM | POA: Diagnosis not present

## 2013-12-08 DIAGNOSIS — S92309A Fracture of unspecified metatarsal bone(s), unspecified foot, initial encounter for closed fracture: Secondary | ICD-10-CM | POA: Diagnosis not present

## 2013-12-08 DIAGNOSIS — M79609 Pain in unspecified limb: Secondary | ICD-10-CM | POA: Diagnosis not present

## 2013-12-08 DIAGNOSIS — S99919A Unspecified injury of unspecified ankle, initial encounter: Secondary | ICD-10-CM | POA: Diagnosis not present

## 2013-12-12 DIAGNOSIS — S92309A Fracture of unspecified metatarsal bone(s), unspecified foot, initial encounter for closed fracture: Secondary | ICD-10-CM | POA: Diagnosis not present

## 2013-12-13 DIAGNOSIS — G5620 Lesion of ulnar nerve, unspecified upper limb: Secondary | ICD-10-CM | POA: Diagnosis not present

## 2014-01-13 DIAGNOSIS — S92309A Fracture of unspecified metatarsal bone(s), unspecified foot, initial encounter for closed fracture: Secondary | ICD-10-CM | POA: Diagnosis not present

## 2014-01-13 DIAGNOSIS — S92909A Unspecified fracture of unspecified foot, initial encounter for closed fracture: Secondary | ICD-10-CM | POA: Diagnosis not present

## 2014-01-26 DIAGNOSIS — E039 Hypothyroidism, unspecified: Secondary | ICD-10-CM | POA: Diagnosis not present

## 2014-01-26 DIAGNOSIS — I959 Hypotension, unspecified: Secondary | ICD-10-CM | POA: Diagnosis not present

## 2014-02-10 DIAGNOSIS — H43819 Vitreous degeneration, unspecified eye: Secondary | ICD-10-CM | POA: Diagnosis not present

## 2014-02-22 DIAGNOSIS — R5383 Other fatigue: Secondary | ICD-10-CM | POA: Diagnosis not present

## 2014-02-22 DIAGNOSIS — R5381 Other malaise: Secondary | ICD-10-CM | POA: Diagnosis not present

## 2014-02-22 DIAGNOSIS — F411 Generalized anxiety disorder: Secondary | ICD-10-CM | POA: Diagnosis not present

## 2014-02-22 DIAGNOSIS — J3089 Other allergic rhinitis: Secondary | ICD-10-CM | POA: Diagnosis not present

## 2014-02-22 DIAGNOSIS — J449 Chronic obstructive pulmonary disease, unspecified: Secondary | ICD-10-CM | POA: Diagnosis not present

## 2014-02-22 IMAGING — NM NM BONE 3 PHASE
2 series · 12 of 12 positions shown · non-contrast
Comparison: None.

CLINICAL DATA: Left knee replacement 6 years prior.  Fall 5 months
prior..  Left knee pain.

NUCLEAR MEDICINE THREE PHASE BONE SCAN
TECHNIQUE: Radionuclide angiographic images, immediate static
blood pool images, and 3-hour delayed static images were obtained
after intravenous injection of radiopharmaceutical.
Radiopharmaceutical: [id number] JAYQUAN ANTONELLI TECHNETIUM TC 99M
MEDRONATE IV KIT

[Series 1: fl flow and static · 4.75mm/px · 6 of 40 frames shown (1 of 2)]
[frame 4/40  full-range]
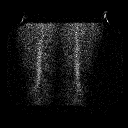
[frame 10/40  full-range]
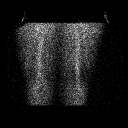
[frame 17/40  full-range]
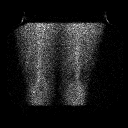
[frame 24/40  full-range]
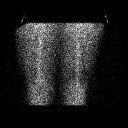
[frame 30/40  full-range]
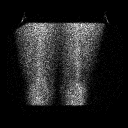
[frame 37/40  full-range]
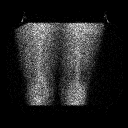

[Series 1: fl flow and static · 4.75mm/px · 6 of 40 frames shown (2 of 2)]
[frame 4/40  full-range]
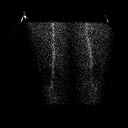
[frame 10/40  full-range]
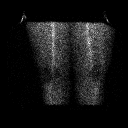
[frame 17/40  full-range]
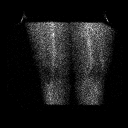
[frame 24/40  full-range]
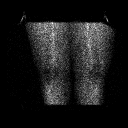
[frame 30/40  full-range]
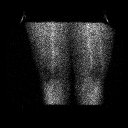
[frame 37/40  full-range]
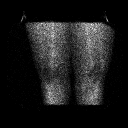

[12 of 12 positions shown; findings below may reference images not displayed]

FINDINGS: Vascular phase:  There is no asymmetric or increased blood flow to
the left or right lower extremity.

Blood pool phase:  No asymmetric or increased blood pool activity.

Delayed phase:  There is mild peri prosthetic uptake within the
left tibial plateau and femoral condyles which is felt to be within
normal limits post arthroplasty.
IMPRESSION: No evidence of loosening or infection of the left knee prosthetic.

## 2014-02-24 DIAGNOSIS — S92309A Fracture of unspecified metatarsal bone(s), unspecified foot, initial encounter for closed fracture: Secondary | ICD-10-CM | POA: Diagnosis not present

## 2014-03-07 DIAGNOSIS — H6590 Unspecified nonsuppurative otitis media, unspecified ear: Secondary | ICD-10-CM | POA: Diagnosis not present

## 2014-03-07 DIAGNOSIS — Z13820 Encounter for screening for osteoporosis: Secondary | ICD-10-CM | POA: Diagnosis not present

## 2014-03-07 DIAGNOSIS — E039 Hypothyroidism, unspecified: Secondary | ICD-10-CM | POA: Diagnosis not present

## 2014-03-07 DIAGNOSIS — B370 Candidal stomatitis: Secondary | ICD-10-CM | POA: Diagnosis not present

## 2014-03-17 DIAGNOSIS — R42 Dizziness and giddiness: Secondary | ICD-10-CM | POA: Diagnosis not present

## 2014-03-17 DIAGNOSIS — H65199 Other acute nonsuppurative otitis media, unspecified ear: Secondary | ICD-10-CM | POA: Diagnosis not present

## 2014-04-13 DIAGNOSIS — M545 Low back pain: Secondary | ICD-10-CM | POA: Diagnosis not present

## 2014-04-28 DIAGNOSIS — M47816 Spondylosis without myelopathy or radiculopathy, lumbar region: Secondary | ICD-10-CM | POA: Diagnosis not present

## 2014-05-01 DIAGNOSIS — Z1231 Encounter for screening mammogram for malignant neoplasm of breast: Secondary | ICD-10-CM | POA: Diagnosis not present

## 2014-05-01 DIAGNOSIS — M810 Age-related osteoporosis without current pathological fracture: Secondary | ICD-10-CM | POA: Diagnosis not present

## 2014-05-01 DIAGNOSIS — Z13820 Encounter for screening for osteoporosis: Secondary | ICD-10-CM | POA: Diagnosis not present

## 2014-05-02 DIAGNOSIS — Z0000 Encounter for general adult medical examination without abnormal findings: Secondary | ICD-10-CM | POA: Diagnosis not present

## 2014-05-02 DIAGNOSIS — E039 Hypothyroidism, unspecified: Secondary | ICD-10-CM | POA: Diagnosis not present

## 2014-05-04 DIAGNOSIS — M545 Low back pain: Secondary | ICD-10-CM | POA: Diagnosis not present

## 2014-05-23 DIAGNOSIS — K219 Gastro-esophageal reflux disease without esophagitis: Secondary | ICD-10-CM | POA: Diagnosis not present

## 2014-05-23 DIAGNOSIS — M810 Age-related osteoporosis without current pathological fracture: Secondary | ICD-10-CM | POA: Diagnosis not present

## 2014-05-23 DIAGNOSIS — E039 Hypothyroidism, unspecified: Secondary | ICD-10-CM | POA: Diagnosis not present

## 2014-06-02 DIAGNOSIS — R112 Nausea with vomiting, unspecified: Secondary | ICD-10-CM | POA: Diagnosis not present

## 2014-06-02 DIAGNOSIS — E039 Hypothyroidism, unspecified: Secondary | ICD-10-CM | POA: Diagnosis not present

## 2014-06-02 DIAGNOSIS — R55 Syncope and collapse: Secondary | ICD-10-CM | POA: Diagnosis not present

## 2014-06-02 DIAGNOSIS — R0602 Shortness of breath: Secondary | ICD-10-CM | POA: Diagnosis not present

## 2014-06-02 DIAGNOSIS — F17200 Nicotine dependence, unspecified, uncomplicated: Secondary | ICD-10-CM | POA: Diagnosis not present

## 2014-06-02 DIAGNOSIS — I517 Cardiomegaly: Secondary | ICD-10-CM | POA: Diagnosis not present

## 2014-06-02 DIAGNOSIS — J449 Chronic obstructive pulmonary disease, unspecified: Secondary | ICD-10-CM | POA: Diagnosis not present

## 2014-06-02 DIAGNOSIS — R069 Unspecified abnormalities of breathing: Secondary | ICD-10-CM | POA: Diagnosis not present

## 2014-06-02 DIAGNOSIS — I10 Essential (primary) hypertension: Secondary | ICD-10-CM | POA: Diagnosis not present

## 2014-06-02 DIAGNOSIS — R197 Diarrhea, unspecified: Secondary | ICD-10-CM | POA: Diagnosis not present

## 2014-06-22 DIAGNOSIS — Z96659 Presence of unspecified artificial knee joint: Secondary | ICD-10-CM | POA: Diagnosis not present

## 2014-07-04 DIAGNOSIS — E039 Hypothyroidism, unspecified: Secondary | ICD-10-CM | POA: Diagnosis not present

## 2014-07-04 DIAGNOSIS — M25569 Pain in unspecified knee: Secondary | ICD-10-CM | POA: Diagnosis not present

## 2014-07-04 DIAGNOSIS — D72829 Elevated white blood cell count, unspecified: Secondary | ICD-10-CM | POA: Diagnosis not present

## 2014-07-14 DIAGNOSIS — J449 Chronic obstructive pulmonary disease, unspecified: Secondary | ICD-10-CM | POA: Diagnosis not present

## 2014-07-18 DIAGNOSIS — Z9089 Acquired absence of other organs: Secondary | ICD-10-CM | POA: Diagnosis present

## 2014-07-18 DIAGNOSIS — R400 Somnolence: Secondary | ICD-10-CM | POA: Diagnosis not present

## 2014-07-18 DIAGNOSIS — J9692 Respiratory failure, unspecified with hypercapnia: Secondary | ICD-10-CM | POA: Diagnosis not present

## 2014-07-18 DIAGNOSIS — J9690 Respiratory failure, unspecified, unspecified whether with hypoxia or hypercapnia: Secondary | ICD-10-CM | POA: Diagnosis not present

## 2014-07-18 DIAGNOSIS — J9621 Acute and chronic respiratory failure with hypoxia: Secondary | ICD-10-CM | POA: Diagnosis not present

## 2014-07-18 DIAGNOSIS — Z881 Allergy status to other antibiotic agents status: Secondary | ICD-10-CM | POA: Diagnosis not present

## 2014-07-18 DIAGNOSIS — J180 Bronchopneumonia, unspecified organism: Secondary | ICD-10-CM | POA: Diagnosis not present

## 2014-07-18 DIAGNOSIS — M1990 Unspecified osteoarthritis, unspecified site: Secondary | ICD-10-CM | POA: Diagnosis present

## 2014-07-18 DIAGNOSIS — E860 Dehydration: Secondary | ICD-10-CM | POA: Diagnosis not present

## 2014-07-18 DIAGNOSIS — Z96652 Presence of left artificial knee joint: Secondary | ICD-10-CM | POA: Diagnosis present

## 2014-07-18 DIAGNOSIS — I517 Cardiomegaly: Secondary | ICD-10-CM | POA: Diagnosis not present

## 2014-07-18 DIAGNOSIS — Z9889 Other specified postprocedural states: Secondary | ICD-10-CM | POA: Diagnosis not present

## 2014-07-18 DIAGNOSIS — J439 Emphysema, unspecified: Secondary | ICD-10-CM | POA: Diagnosis not present

## 2014-07-18 DIAGNOSIS — J159 Unspecified bacterial pneumonia: Secondary | ICD-10-CM | POA: Diagnosis not present

## 2014-07-18 DIAGNOSIS — J9620 Acute and chronic respiratory failure, unspecified whether with hypoxia or hypercapnia: Secondary | ICD-10-CM | POA: Diagnosis not present

## 2014-07-18 DIAGNOSIS — R7989 Other specified abnormal findings of blood chemistry: Secondary | ICD-10-CM | POA: Diagnosis not present

## 2014-07-18 DIAGNOSIS — G92 Toxic encephalopathy: Secondary | ICD-10-CM | POA: Diagnosis not present

## 2014-07-18 DIAGNOSIS — Z91012 Allergy to eggs: Secondary | ICD-10-CM | POA: Diagnosis not present

## 2014-07-18 DIAGNOSIS — R0602 Shortness of breath: Secondary | ICD-10-CM | POA: Diagnosis not present

## 2014-07-18 DIAGNOSIS — Z886 Allergy status to analgesic agent status: Secondary | ICD-10-CM | POA: Diagnosis not present

## 2014-07-18 DIAGNOSIS — E785 Hyperlipidemia, unspecified: Secondary | ICD-10-CM | POA: Diagnosis not present

## 2014-07-18 DIAGNOSIS — J441 Chronic obstructive pulmonary disease with (acute) exacerbation: Secondary | ICD-10-CM | POA: Diagnosis not present

## 2014-07-18 DIAGNOSIS — F329 Major depressive disorder, single episode, unspecified: Secondary | ICD-10-CM | POA: Diagnosis present

## 2014-07-18 DIAGNOSIS — I10 Essential (primary) hypertension: Secondary | ICD-10-CM | POA: Diagnosis present

## 2014-07-18 DIAGNOSIS — J9602 Acute respiratory failure with hypercapnia: Secondary | ICD-10-CM | POA: Diagnosis not present

## 2014-07-18 DIAGNOSIS — J9622 Acute and chronic respiratory failure with hypercapnia: Secondary | ICD-10-CM | POA: Diagnosis not present

## 2014-07-18 DIAGNOSIS — J40 Bronchitis, not specified as acute or chronic: Secondary | ICD-10-CM | POA: Diagnosis not present

## 2014-07-18 DIAGNOSIS — D72829 Elevated white blood cell count, unspecified: Secondary | ICD-10-CM | POA: Diagnosis not present

## 2014-07-18 DIAGNOSIS — E039 Hypothyroidism, unspecified: Secondary | ICD-10-CM | POA: Diagnosis present

## 2014-07-18 DIAGNOSIS — I361 Nonrheumatic tricuspid (valve) insufficiency: Secondary | ICD-10-CM | POA: Diagnosis not present

## 2014-07-18 DIAGNOSIS — J45909 Unspecified asthma, uncomplicated: Secondary | ICD-10-CM | POA: Diagnosis present

## 2014-07-18 DIAGNOSIS — R4182 Altered mental status, unspecified: Secondary | ICD-10-CM | POA: Diagnosis not present

## 2014-07-18 DIAGNOSIS — Z90710 Acquired absence of both cervix and uterus: Secondary | ICD-10-CM | POA: Diagnosis not present

## 2014-07-18 DIAGNOSIS — R0600 Dyspnea, unspecified: Secondary | ICD-10-CM | POA: Diagnosis not present

## 2014-07-24 DIAGNOSIS — M25562 Pain in left knee: Secondary | ICD-10-CM | POA: Diagnosis not present

## 2014-07-31 DIAGNOSIS — T84043A Periprosthetic fracture around internal prosthetic left knee joint, initial encounter: Secondary | ICD-10-CM | POA: Diagnosis not present

## 2014-08-01 DIAGNOSIS — J449 Chronic obstructive pulmonary disease, unspecified: Secondary | ICD-10-CM | POA: Diagnosis not present

## 2014-08-01 DIAGNOSIS — F419 Anxiety disorder, unspecified: Secondary | ICD-10-CM | POA: Diagnosis not present

## 2014-08-01 DIAGNOSIS — Z09 Encounter for follow-up examination after completed treatment for conditions other than malignant neoplasm: Secondary | ICD-10-CM | POA: Diagnosis not present

## 2014-08-01 DIAGNOSIS — S7292XA Unspecified fracture of left femur, initial encounter for closed fracture: Secondary | ICD-10-CM | POA: Diagnosis not present

## 2014-08-04 DIAGNOSIS — J189 Pneumonia, unspecified organism: Secondary | ICD-10-CM | POA: Diagnosis not present

## 2014-08-04 DIAGNOSIS — F411 Generalized anxiety disorder: Secondary | ICD-10-CM | POA: Diagnosis not present

## 2014-08-04 DIAGNOSIS — R5383 Other fatigue: Secondary | ICD-10-CM | POA: Diagnosis not present

## 2014-08-04 DIAGNOSIS — J449 Chronic obstructive pulmonary disease, unspecified: Secondary | ICD-10-CM | POA: Diagnosis not present

## 2014-08-07 DIAGNOSIS — T84043D Periprosthetic fracture around internal prosthetic left knee joint, subsequent encounter: Secondary | ICD-10-CM | POA: Diagnosis not present

## 2014-08-17 DIAGNOSIS — R202 Paresthesia of skin: Secondary | ICD-10-CM | POA: Diagnosis not present

## 2014-08-17 DIAGNOSIS — J449 Chronic obstructive pulmonary disease, unspecified: Secondary | ICD-10-CM | POA: Diagnosis not present

## 2014-08-17 DIAGNOSIS — F419 Anxiety disorder, unspecified: Secondary | ICD-10-CM | POA: Diagnosis not present

## 2014-08-17 DIAGNOSIS — S7292XA Unspecified fracture of left femur, initial encounter for closed fracture: Secondary | ICD-10-CM | POA: Diagnosis not present

## 2014-08-24 DIAGNOSIS — R202 Paresthesia of skin: Secondary | ICD-10-CM | POA: Diagnosis not present

## 2014-08-28 DIAGNOSIS — T84043D Periprosthetic fracture around internal prosthetic left knee joint, subsequent encounter: Secondary | ICD-10-CM | POA: Diagnosis not present

## 2014-08-28 DIAGNOSIS — M25571 Pain in right ankle and joints of right foot: Secondary | ICD-10-CM | POA: Diagnosis not present

## 2014-08-29 DIAGNOSIS — T84043D Periprosthetic fracture around internal prosthetic left knee joint, subsequent encounter: Secondary | ICD-10-CM | POA: Diagnosis not present

## 2014-08-29 DIAGNOSIS — J449 Chronic obstructive pulmonary disease, unspecified: Secondary | ICD-10-CM | POA: Diagnosis not present

## 2014-08-29 DIAGNOSIS — H548 Legal blindness, as defined in USA: Secondary | ICD-10-CM | POA: Diagnosis not present

## 2014-08-31 DIAGNOSIS — H548 Legal blindness, as defined in USA: Secondary | ICD-10-CM | POA: Diagnosis not present

## 2014-08-31 DIAGNOSIS — T84043D Periprosthetic fracture around internal prosthetic left knee joint, subsequent encounter: Secondary | ICD-10-CM | POA: Diagnosis not present

## 2014-08-31 DIAGNOSIS — J449 Chronic obstructive pulmonary disease, unspecified: Secondary | ICD-10-CM | POA: Diagnosis not present

## 2014-09-04 DIAGNOSIS — H548 Legal blindness, as defined in USA: Secondary | ICD-10-CM | POA: Diagnosis not present

## 2014-09-04 DIAGNOSIS — J449 Chronic obstructive pulmonary disease, unspecified: Secondary | ICD-10-CM | POA: Diagnosis not present

## 2014-09-04 DIAGNOSIS — T84043D Periprosthetic fracture around internal prosthetic left knee joint, subsequent encounter: Secondary | ICD-10-CM | POA: Diagnosis not present

## 2014-09-06 DIAGNOSIS — T84043D Periprosthetic fracture around internal prosthetic left knee joint, subsequent encounter: Secondary | ICD-10-CM | POA: Diagnosis not present

## 2014-09-06 DIAGNOSIS — J449 Chronic obstructive pulmonary disease, unspecified: Secondary | ICD-10-CM | POA: Diagnosis not present

## 2014-09-06 DIAGNOSIS — H548 Legal blindness, as defined in USA: Secondary | ICD-10-CM | POA: Diagnosis not present

## 2014-09-12 DIAGNOSIS — J449 Chronic obstructive pulmonary disease, unspecified: Secondary | ICD-10-CM | POA: Diagnosis not present

## 2014-09-12 DIAGNOSIS — H548 Legal blindness, as defined in USA: Secondary | ICD-10-CM | POA: Diagnosis not present

## 2014-09-12 DIAGNOSIS — T84043D Periprosthetic fracture around internal prosthetic left knee joint, subsequent encounter: Secondary | ICD-10-CM | POA: Diagnosis not present

## 2014-09-14 DIAGNOSIS — H548 Legal blindness, as defined in USA: Secondary | ICD-10-CM | POA: Diagnosis not present

## 2014-09-14 DIAGNOSIS — T84043D Periprosthetic fracture around internal prosthetic left knee joint, subsequent encounter: Secondary | ICD-10-CM | POA: Diagnosis not present

## 2014-09-14 DIAGNOSIS — J449 Chronic obstructive pulmonary disease, unspecified: Secondary | ICD-10-CM | POA: Diagnosis not present

## 2014-09-18 DIAGNOSIS — T84043D Periprosthetic fracture around internal prosthetic left knee joint, subsequent encounter: Secondary | ICD-10-CM | POA: Diagnosis not present

## 2014-09-19 DIAGNOSIS — H548 Legal blindness, as defined in USA: Secondary | ICD-10-CM | POA: Diagnosis not present

## 2014-09-19 DIAGNOSIS — J449 Chronic obstructive pulmonary disease, unspecified: Secondary | ICD-10-CM | POA: Diagnosis not present

## 2014-09-19 DIAGNOSIS — R202 Paresthesia of skin: Secondary | ICD-10-CM | POA: Diagnosis not present

## 2014-09-19 DIAGNOSIS — Z6824 Body mass index (BMI) 24.0-24.9, adult: Secondary | ICD-10-CM | POA: Diagnosis not present

## 2014-09-19 DIAGNOSIS — T84043D Periprosthetic fracture around internal prosthetic left knee joint, subsequent encounter: Secondary | ICD-10-CM | POA: Diagnosis not present

## 2014-09-19 DIAGNOSIS — I10 Essential (primary) hypertension: Secondary | ICD-10-CM | POA: Diagnosis not present

## 2014-09-19 DIAGNOSIS — S7292XA Unspecified fracture of left femur, initial encounter for closed fracture: Secondary | ICD-10-CM | POA: Diagnosis not present

## 2014-09-20 DIAGNOSIS — R222 Localized swelling, mass and lump, trunk: Secondary | ICD-10-CM | POA: Diagnosis not present

## 2014-09-21 DIAGNOSIS — M545 Low back pain: Secondary | ICD-10-CM | POA: Diagnosis not present

## 2014-09-21 DIAGNOSIS — D171 Benign lipomatous neoplasm of skin and subcutaneous tissue of trunk: Secondary | ICD-10-CM | POA: Diagnosis not present

## 2014-09-25 DIAGNOSIS — R202 Paresthesia of skin: Secondary | ICD-10-CM | POA: Diagnosis not present

## 2014-10-02 DIAGNOSIS — F411 Generalized anxiety disorder: Secondary | ICD-10-CM | POA: Diagnosis not present

## 2014-10-02 DIAGNOSIS — R5383 Other fatigue: Secondary | ICD-10-CM | POA: Diagnosis not present

## 2014-10-02 DIAGNOSIS — J309 Allergic rhinitis, unspecified: Secondary | ICD-10-CM | POA: Diagnosis not present

## 2014-10-02 DIAGNOSIS — J441 Chronic obstructive pulmonary disease with (acute) exacerbation: Secondary | ICD-10-CM | POA: Diagnosis not present

## 2014-10-05 DIAGNOSIS — R202 Paresthesia of skin: Secondary | ICD-10-CM | POA: Diagnosis not present

## 2014-10-12 DIAGNOSIS — E538 Deficiency of other specified B group vitamins: Secondary | ICD-10-CM | POA: Diagnosis not present

## 2014-10-18 DIAGNOSIS — T84043D Periprosthetic fracture around internal prosthetic left knee joint, subsequent encounter: Secondary | ICD-10-CM | POA: Diagnosis not present

## 2014-10-19 DIAGNOSIS — T84043D Periprosthetic fracture around internal prosthetic left knee joint, subsequent encounter: Secondary | ICD-10-CM | POA: Diagnosis not present

## 2014-10-19 DIAGNOSIS — H548 Legal blindness, as defined in USA: Secondary | ICD-10-CM | POA: Diagnosis not present

## 2014-10-19 DIAGNOSIS — J449 Chronic obstructive pulmonary disease, unspecified: Secondary | ICD-10-CM | POA: Diagnosis not present

## 2014-10-20 DIAGNOSIS — E539 Vitamin B deficiency, unspecified: Secondary | ICD-10-CM | POA: Diagnosis not present

## 2014-10-20 DIAGNOSIS — R202 Paresthesia of skin: Secondary | ICD-10-CM | POA: Diagnosis not present

## 2014-10-20 DIAGNOSIS — E039 Hypothyroidism, unspecified: Secondary | ICD-10-CM | POA: Diagnosis not present

## 2014-10-20 DIAGNOSIS — J449 Chronic obstructive pulmonary disease, unspecified: Secondary | ICD-10-CM | POA: Diagnosis not present

## 2014-10-20 DIAGNOSIS — S7292XA Unspecified fracture of left femur, initial encounter for closed fracture: Secondary | ICD-10-CM | POA: Diagnosis not present

## 2014-10-20 DIAGNOSIS — G250 Essential tremor: Secondary | ICD-10-CM | POA: Diagnosis not present

## 2014-10-20 DIAGNOSIS — K219 Gastro-esophageal reflux disease without esophagitis: Secondary | ICD-10-CM | POA: Diagnosis not present

## 2014-10-23 DIAGNOSIS — H548 Legal blindness, as defined in USA: Secondary | ICD-10-CM | POA: Diagnosis not present

## 2014-10-23 DIAGNOSIS — J449 Chronic obstructive pulmonary disease, unspecified: Secondary | ICD-10-CM | POA: Diagnosis not present

## 2014-10-23 DIAGNOSIS — T84043D Periprosthetic fracture around internal prosthetic left knee joint, subsequent encounter: Secondary | ICD-10-CM | POA: Diagnosis not present

## 2014-10-25 DIAGNOSIS — H548 Legal blindness, as defined in USA: Secondary | ICD-10-CM | POA: Diagnosis not present

## 2014-10-25 DIAGNOSIS — J449 Chronic obstructive pulmonary disease, unspecified: Secondary | ICD-10-CM | POA: Diagnosis not present

## 2014-10-25 DIAGNOSIS — T84043D Periprosthetic fracture around internal prosthetic left knee joint, subsequent encounter: Secondary | ICD-10-CM | POA: Diagnosis not present

## 2014-10-27 DIAGNOSIS — H548 Legal blindness, as defined in USA: Secondary | ICD-10-CM | POA: Diagnosis not present

## 2014-10-27 DIAGNOSIS — T84043D Periprosthetic fracture around internal prosthetic left knee joint, subsequent encounter: Secondary | ICD-10-CM | POA: Diagnosis not present

## 2014-10-27 DIAGNOSIS — J449 Chronic obstructive pulmonary disease, unspecified: Secondary | ICD-10-CM | POA: Diagnosis not present

## 2014-10-28 DIAGNOSIS — J449 Chronic obstructive pulmonary disease, unspecified: Secondary | ICD-10-CM | POA: Diagnosis not present

## 2014-10-28 DIAGNOSIS — H548 Legal blindness, as defined in USA: Secondary | ICD-10-CM | POA: Diagnosis not present

## 2014-10-28 DIAGNOSIS — T84043D Periprosthetic fracture around internal prosthetic left knee joint, subsequent encounter: Secondary | ICD-10-CM | POA: Diagnosis not present

## 2014-10-31 DIAGNOSIS — H548 Legal blindness, as defined in USA: Secondary | ICD-10-CM | POA: Diagnosis not present

## 2014-10-31 DIAGNOSIS — J449 Chronic obstructive pulmonary disease, unspecified: Secondary | ICD-10-CM | POA: Diagnosis not present

## 2014-10-31 DIAGNOSIS — T84043D Periprosthetic fracture around internal prosthetic left knee joint, subsequent encounter: Secondary | ICD-10-CM | POA: Diagnosis not present

## 2014-11-02 DIAGNOSIS — H548 Legal blindness, as defined in USA: Secondary | ICD-10-CM | POA: Diagnosis not present

## 2014-11-02 DIAGNOSIS — T84043D Periprosthetic fracture around internal prosthetic left knee joint, subsequent encounter: Secondary | ICD-10-CM | POA: Diagnosis not present

## 2014-11-02 DIAGNOSIS — J449 Chronic obstructive pulmonary disease, unspecified: Secondary | ICD-10-CM | POA: Diagnosis not present

## 2014-11-07 DIAGNOSIS — T84043D Periprosthetic fracture around internal prosthetic left knee joint, subsequent encounter: Secondary | ICD-10-CM | POA: Diagnosis not present

## 2014-11-07 DIAGNOSIS — J449 Chronic obstructive pulmonary disease, unspecified: Secondary | ICD-10-CM | POA: Diagnosis not present

## 2014-11-07 DIAGNOSIS — H548 Legal blindness, as defined in USA: Secondary | ICD-10-CM | POA: Diagnosis not present

## 2014-11-09 DIAGNOSIS — J449 Chronic obstructive pulmonary disease, unspecified: Secondary | ICD-10-CM | POA: Diagnosis not present

## 2014-11-09 DIAGNOSIS — H548 Legal blindness, as defined in USA: Secondary | ICD-10-CM | POA: Diagnosis not present

## 2014-11-09 DIAGNOSIS — T84043D Periprosthetic fracture around internal prosthetic left knee joint, subsequent encounter: Secondary | ICD-10-CM | POA: Diagnosis not present

## 2014-11-13 DIAGNOSIS — H548 Legal blindness, as defined in USA: Secondary | ICD-10-CM | POA: Diagnosis not present

## 2014-11-13 DIAGNOSIS — T84043D Periprosthetic fracture around internal prosthetic left knee joint, subsequent encounter: Secondary | ICD-10-CM | POA: Diagnosis not present

## 2014-11-13 DIAGNOSIS — J449 Chronic obstructive pulmonary disease, unspecified: Secondary | ICD-10-CM | POA: Diagnosis not present

## 2014-11-16 DIAGNOSIS — T84043D Periprosthetic fracture around internal prosthetic left knee joint, subsequent encounter: Secondary | ICD-10-CM | POA: Diagnosis not present

## 2014-11-17 DIAGNOSIS — T84043D Periprosthetic fracture around internal prosthetic left knee joint, subsequent encounter: Secondary | ICD-10-CM | POA: Diagnosis not present

## 2014-11-17 DIAGNOSIS — J449 Chronic obstructive pulmonary disease, unspecified: Secondary | ICD-10-CM | POA: Diagnosis not present

## 2014-11-17 DIAGNOSIS — H548 Legal blindness, as defined in USA: Secondary | ICD-10-CM | POA: Diagnosis not present

## 2014-11-20 DIAGNOSIS — S7292XA Unspecified fracture of left femur, initial encounter for closed fracture: Secondary | ICD-10-CM | POA: Diagnosis not present

## 2014-11-20 DIAGNOSIS — R202 Paresthesia of skin: Secondary | ICD-10-CM | POA: Diagnosis not present

## 2014-11-20 DIAGNOSIS — M810 Age-related osteoporosis without current pathological fracture: Secondary | ICD-10-CM | POA: Diagnosis not present

## 2014-11-20 DIAGNOSIS — E538 Deficiency of other specified B group vitamins: Secondary | ICD-10-CM | POA: Diagnosis not present

## 2014-11-20 DIAGNOSIS — Z6823 Body mass index (BMI) 23.0-23.9, adult: Secondary | ICD-10-CM | POA: Diagnosis not present

## 2014-11-20 DIAGNOSIS — R2233 Localized swelling, mass and lump, upper limb, bilateral: Secondary | ICD-10-CM | POA: Diagnosis not present

## 2014-11-27 DIAGNOSIS — M25462 Effusion, left knee: Secondary | ICD-10-CM | POA: Diagnosis not present

## 2014-11-27 DIAGNOSIS — M6281 Muscle weakness (generalized): Secondary | ICD-10-CM | POA: Diagnosis not present

## 2014-11-27 DIAGNOSIS — M25562 Pain in left knee: Secondary | ICD-10-CM | POA: Diagnosis not present

## 2014-11-29 DIAGNOSIS — M25462 Effusion, left knee: Secondary | ICD-10-CM | POA: Diagnosis not present

## 2014-11-29 DIAGNOSIS — M25562 Pain in left knee: Secondary | ICD-10-CM | POA: Diagnosis not present

## 2014-11-29 DIAGNOSIS — M6281 Muscle weakness (generalized): Secondary | ICD-10-CM | POA: Diagnosis not present

## 2014-12-01 DIAGNOSIS — M25562 Pain in left knee: Secondary | ICD-10-CM | POA: Diagnosis not present

## 2014-12-01 DIAGNOSIS — M6281 Muscle weakness (generalized): Secondary | ICD-10-CM | POA: Diagnosis not present

## 2014-12-01 DIAGNOSIS — M25462 Effusion, left knee: Secondary | ICD-10-CM | POA: Diagnosis not present

## 2014-12-06 DIAGNOSIS — M6281 Muscle weakness (generalized): Secondary | ICD-10-CM | POA: Diagnosis not present

## 2014-12-06 DIAGNOSIS — M25562 Pain in left knee: Secondary | ICD-10-CM | POA: Diagnosis not present

## 2014-12-06 DIAGNOSIS — M25462 Effusion, left knee: Secondary | ICD-10-CM | POA: Diagnosis not present

## 2014-12-08 DIAGNOSIS — M25462 Effusion, left knee: Secondary | ICD-10-CM | POA: Diagnosis not present

## 2014-12-08 DIAGNOSIS — M6281 Muscle weakness (generalized): Secondary | ICD-10-CM | POA: Diagnosis not present

## 2014-12-08 DIAGNOSIS — M25562 Pain in left knee: Secondary | ICD-10-CM | POA: Diagnosis not present

## 2014-12-11 DIAGNOSIS — M25462 Effusion, left knee: Secondary | ICD-10-CM | POA: Diagnosis not present

## 2014-12-11 DIAGNOSIS — M25562 Pain in left knee: Secondary | ICD-10-CM | POA: Diagnosis not present

## 2014-12-11 DIAGNOSIS — M6281 Muscle weakness (generalized): Secondary | ICD-10-CM | POA: Diagnosis not present

## 2014-12-13 DIAGNOSIS — M25462 Effusion, left knee: Secondary | ICD-10-CM | POA: Diagnosis not present

## 2014-12-13 DIAGNOSIS — M25562 Pain in left knee: Secondary | ICD-10-CM | POA: Diagnosis not present

## 2014-12-13 DIAGNOSIS — M6281 Muscle weakness (generalized): Secondary | ICD-10-CM | POA: Diagnosis not present

## 2014-12-15 DIAGNOSIS — M6281 Muscle weakness (generalized): Secondary | ICD-10-CM | POA: Diagnosis not present

## 2014-12-15 DIAGNOSIS — M25462 Effusion, left knee: Secondary | ICD-10-CM | POA: Diagnosis not present

## 2014-12-15 DIAGNOSIS — M25562 Pain in left knee: Secondary | ICD-10-CM | POA: Diagnosis not present

## 2014-12-18 DIAGNOSIS — Z6825 Body mass index (BMI) 25.0-25.9, adult: Secondary | ICD-10-CM | POA: Diagnosis not present

## 2014-12-18 DIAGNOSIS — J441 Chronic obstructive pulmonary disease with (acute) exacerbation: Secondary | ICD-10-CM | POA: Diagnosis not present

## 2014-12-19 DIAGNOSIS — J441 Chronic obstructive pulmonary disease with (acute) exacerbation: Secondary | ICD-10-CM | POA: Diagnosis not present

## 2014-12-19 DIAGNOSIS — I517 Cardiomegaly: Secondary | ICD-10-CM | POA: Diagnosis not present

## 2014-12-19 DIAGNOSIS — R05 Cough: Secondary | ICD-10-CM | POA: Diagnosis not present

## 2014-12-19 DIAGNOSIS — R079 Chest pain, unspecified: Secondary | ICD-10-CM | POA: Diagnosis not present

## 2014-12-26 DIAGNOSIS — I10 Essential (primary) hypertension: Secondary | ICD-10-CM | POA: Diagnosis not present

## 2014-12-26 DIAGNOSIS — M25569 Pain in unspecified knee: Secondary | ICD-10-CM | POA: Diagnosis not present

## 2014-12-26 DIAGNOSIS — Z6826 Body mass index (BMI) 26.0-26.9, adult: Secondary | ICD-10-CM | POA: Diagnosis not present

## 2014-12-26 DIAGNOSIS — E538 Deficiency of other specified B group vitamins: Secondary | ICD-10-CM | POA: Diagnosis not present

## 2015-01-15 DIAGNOSIS — T84043A Periprosthetic fracture around internal prosthetic left knee joint, initial encounter: Secondary | ICD-10-CM | POA: Diagnosis not present

## 2015-01-26 DIAGNOSIS — M25569 Pain in unspecified knee: Secondary | ICD-10-CM | POA: Diagnosis not present

## 2015-01-26 DIAGNOSIS — E538 Deficiency of other specified B group vitamins: Secondary | ICD-10-CM | POA: Diagnosis not present

## 2015-01-26 DIAGNOSIS — Z9981 Dependence on supplemental oxygen: Secondary | ICD-10-CM | POA: Diagnosis not present

## 2015-01-26 DIAGNOSIS — I10 Essential (primary) hypertension: Secondary | ICD-10-CM | POA: Diagnosis not present

## 2015-01-26 DIAGNOSIS — J449 Chronic obstructive pulmonary disease, unspecified: Secondary | ICD-10-CM | POA: Diagnosis not present

## 2015-01-26 DIAGNOSIS — R200 Anesthesia of skin: Secondary | ICD-10-CM | POA: Diagnosis not present

## 2015-01-26 DIAGNOSIS — Z6826 Body mass index (BMI) 26.0-26.9, adult: Secondary | ICD-10-CM | POA: Diagnosis not present

## 2015-02-15 DIAGNOSIS — M25571 Pain in right ankle and joints of right foot: Secondary | ICD-10-CM | POA: Diagnosis not present

## 2015-02-15 DIAGNOSIS — S92001A Unspecified fracture of right calcaneus, initial encounter for closed fracture: Secondary | ICD-10-CM | POA: Diagnosis not present

## 2015-02-16 DIAGNOSIS — S92001A Unspecified fracture of right calcaneus, initial encounter for closed fracture: Secondary | ICD-10-CM | POA: Diagnosis not present

## 2015-03-02 DIAGNOSIS — E538 Deficiency of other specified B group vitamins: Secondary | ICD-10-CM | POA: Diagnosis not present

## 2015-03-06 DIAGNOSIS — E039 Hypothyroidism, unspecified: Secondary | ICD-10-CM | POA: Diagnosis not present

## 2015-03-06 DIAGNOSIS — R55 Syncope and collapse: Secondary | ICD-10-CM | POA: Diagnosis not present

## 2015-03-06 DIAGNOSIS — Z79899 Other long term (current) drug therapy: Secondary | ICD-10-CM | POA: Diagnosis not present

## 2015-03-06 DIAGNOSIS — E559 Vitamin D deficiency, unspecified: Secondary | ICD-10-CM | POA: Diagnosis not present

## 2015-03-09 DIAGNOSIS — R55 Syncope and collapse: Secondary | ICD-10-CM | POA: Diagnosis not present

## 2015-03-09 DIAGNOSIS — S92001A Unspecified fracture of right calcaneus, initial encounter for closed fracture: Secondary | ICD-10-CM | POA: Diagnosis not present

## 2015-04-02 DIAGNOSIS — E538 Deficiency of other specified B group vitamins: Secondary | ICD-10-CM | POA: Diagnosis not present

## 2015-04-10 DIAGNOSIS — F411 Generalized anxiety disorder: Secondary | ICD-10-CM | POA: Diagnosis not present

## 2015-04-10 DIAGNOSIS — R5383 Other fatigue: Secondary | ICD-10-CM | POA: Diagnosis not present

## 2015-04-10 DIAGNOSIS — J309 Allergic rhinitis, unspecified: Secondary | ICD-10-CM | POA: Diagnosis not present

## 2015-04-10 DIAGNOSIS — J441 Chronic obstructive pulmonary disease with (acute) exacerbation: Secondary | ICD-10-CM | POA: Diagnosis not present

## 2015-04-24 DIAGNOSIS — R200 Anesthesia of skin: Secondary | ICD-10-CM | POA: Diagnosis not present

## 2015-04-24 DIAGNOSIS — G8929 Other chronic pain: Secondary | ICD-10-CM | POA: Diagnosis not present

## 2015-04-24 DIAGNOSIS — Z6826 Body mass index (BMI) 26.0-26.9, adult: Secondary | ICD-10-CM | POA: Diagnosis not present

## 2015-04-24 DIAGNOSIS — M810 Age-related osteoporosis without current pathological fracture: Secondary | ICD-10-CM | POA: Diagnosis not present

## 2015-05-01 DIAGNOSIS — G894 Chronic pain syndrome: Secondary | ICD-10-CM | POA: Diagnosis not present

## 2015-05-01 DIAGNOSIS — F419 Anxiety disorder, unspecified: Secondary | ICD-10-CM | POA: Diagnosis not present

## 2015-05-01 DIAGNOSIS — M961 Postlaminectomy syndrome, not elsewhere classified: Secondary | ICD-10-CM | POA: Diagnosis not present

## 2015-05-01 DIAGNOSIS — M25562 Pain in left knee: Secondary | ICD-10-CM | POA: Diagnosis not present

## 2015-05-01 DIAGNOSIS — G629 Polyneuropathy, unspecified: Secondary | ICD-10-CM | POA: Diagnosis not present

## 2015-05-01 DIAGNOSIS — J449 Chronic obstructive pulmonary disease, unspecified: Secondary | ICD-10-CM | POA: Diagnosis not present

## 2015-05-01 DIAGNOSIS — M4696 Unspecified inflammatory spondylopathy, lumbar region: Secondary | ICD-10-CM | POA: Diagnosis not present

## 2015-05-07 DIAGNOSIS — R197 Diarrhea, unspecified: Secondary | ICD-10-CM | POA: Diagnosis not present

## 2015-05-07 DIAGNOSIS — R072 Precordial pain: Secondary | ICD-10-CM | POA: Diagnosis not present

## 2015-05-07 DIAGNOSIS — I509 Heart failure, unspecified: Secondary | ICD-10-CM | POA: Diagnosis not present

## 2015-05-07 DIAGNOSIS — Z79899 Other long term (current) drug therapy: Secondary | ICD-10-CM | POA: Diagnosis not present

## 2015-05-07 DIAGNOSIS — G629 Polyneuropathy, unspecified: Secondary | ICD-10-CM | POA: Diagnosis not present

## 2015-05-07 DIAGNOSIS — J9610 Chronic respiratory failure, unspecified whether with hypoxia or hypercapnia: Secondary | ICD-10-CM | POA: Diagnosis not present

## 2015-05-07 DIAGNOSIS — I209 Angina pectoris, unspecified: Secondary | ICD-10-CM | POA: Diagnosis not present

## 2015-05-07 DIAGNOSIS — Z9981 Dependence on supplemental oxygen: Secondary | ICD-10-CM | POA: Diagnosis not present

## 2015-05-07 DIAGNOSIS — Z9081 Acquired absence of spleen: Secondary | ICD-10-CM | POA: Diagnosis not present

## 2015-05-07 DIAGNOSIS — I10 Essential (primary) hypertension: Secondary | ICD-10-CM | POA: Diagnosis not present

## 2015-05-07 DIAGNOSIS — E785 Hyperlipidemia, unspecified: Secondary | ICD-10-CM | POA: Diagnosis not present

## 2015-05-07 DIAGNOSIS — R002 Palpitations: Secondary | ICD-10-CM | POA: Diagnosis not present

## 2015-05-07 DIAGNOSIS — E039 Hypothyroidism, unspecified: Secondary | ICD-10-CM | POA: Diagnosis not present

## 2015-05-07 DIAGNOSIS — R9431 Abnormal electrocardiogram [ECG] [EKG]: Secondary | ICD-10-CM | POA: Diagnosis not present

## 2015-05-07 DIAGNOSIS — J449 Chronic obstructive pulmonary disease, unspecified: Secondary | ICD-10-CM | POA: Diagnosis not present

## 2015-05-07 DIAGNOSIS — R079 Chest pain, unspecified: Secondary | ICD-10-CM | POA: Diagnosis not present

## 2015-05-07 DIAGNOSIS — I5030 Unspecified diastolic (congestive) heart failure: Secondary | ICD-10-CM | POA: Diagnosis not present

## 2015-05-07 DIAGNOSIS — F419 Anxiety disorder, unspecified: Secondary | ICD-10-CM | POA: Diagnosis not present

## 2015-05-07 DIAGNOSIS — K219 Gastro-esophageal reflux disease without esophagitis: Secondary | ICD-10-CM | POA: Diagnosis not present

## 2015-05-07 DIAGNOSIS — Z7902 Long term (current) use of antithrombotics/antiplatelets: Secondary | ICD-10-CM | POA: Diagnosis not present

## 2015-05-08 DIAGNOSIS — R0789 Other chest pain: Secondary | ICD-10-CM | POA: Diagnosis not present

## 2015-05-08 DIAGNOSIS — R079 Chest pain, unspecified: Secondary | ICD-10-CM | POA: Diagnosis not present

## 2015-05-08 DIAGNOSIS — R9431 Abnormal electrocardiogram [ECG] [EKG]: Secondary | ICD-10-CM | POA: Diagnosis not present

## 2015-05-08 DIAGNOSIS — R002 Palpitations: Secondary | ICD-10-CM | POA: Diagnosis not present

## 2015-05-08 DIAGNOSIS — J449 Chronic obstructive pulmonary disease, unspecified: Secondary | ICD-10-CM | POA: Diagnosis not present

## 2015-05-15 DIAGNOSIS — Z6826 Body mass index (BMI) 26.0-26.9, adult: Secondary | ICD-10-CM | POA: Diagnosis not present

## 2015-05-15 DIAGNOSIS — G250 Essential tremor: Secondary | ICD-10-CM | POA: Diagnosis not present

## 2015-05-15 DIAGNOSIS — G8929 Other chronic pain: Secondary | ICD-10-CM | POA: Diagnosis not present

## 2015-05-15 DIAGNOSIS — Z09 Encounter for follow-up examination after completed treatment for conditions other than malignant neoplasm: Secondary | ICD-10-CM | POA: Diagnosis not present

## 2015-05-15 DIAGNOSIS — R200 Anesthesia of skin: Secondary | ICD-10-CM | POA: Diagnosis not present

## 2015-05-15 DIAGNOSIS — I10 Essential (primary) hypertension: Secondary | ICD-10-CM | POA: Diagnosis not present

## 2015-05-15 DIAGNOSIS — E538 Deficiency of other specified B group vitamins: Secondary | ICD-10-CM | POA: Diagnosis not present

## 2015-05-16 DIAGNOSIS — J449 Chronic obstructive pulmonary disease, unspecified: Secondary | ICD-10-CM | POA: Diagnosis not present

## 2015-05-16 DIAGNOSIS — F411 Generalized anxiety disorder: Secondary | ICD-10-CM | POA: Diagnosis not present

## 2015-05-16 DIAGNOSIS — R5383 Other fatigue: Secondary | ICD-10-CM | POA: Diagnosis not present

## 2015-05-16 DIAGNOSIS — J309 Allergic rhinitis, unspecified: Secondary | ICD-10-CM | POA: Diagnosis not present

## 2015-05-29 DIAGNOSIS — J309 Allergic rhinitis, unspecified: Secondary | ICD-10-CM | POA: Diagnosis not present

## 2015-05-29 DIAGNOSIS — F411 Generalized anxiety disorder: Secondary | ICD-10-CM | POA: Diagnosis not present

## 2015-05-29 DIAGNOSIS — R5383 Other fatigue: Secondary | ICD-10-CM | POA: Diagnosis not present

## 2015-05-29 DIAGNOSIS — J449 Chronic obstructive pulmonary disease, unspecified: Secondary | ICD-10-CM | POA: Diagnosis not present

## 2015-06-28 DIAGNOSIS — J441 Chronic obstructive pulmonary disease with (acute) exacerbation: Secondary | ICD-10-CM | POA: Diagnosis not present

## 2015-06-28 DIAGNOSIS — Z6827 Body mass index (BMI) 27.0-27.9, adult: Secondary | ICD-10-CM | POA: Diagnosis not present

## 2015-06-28 DIAGNOSIS — E538 Deficiency of other specified B group vitamins: Secondary | ICD-10-CM | POA: Diagnosis not present

## 2015-07-13 DIAGNOSIS — Z1321 Encounter for screening for nutritional disorder: Secondary | ICD-10-CM | POA: Diagnosis not present

## 2015-07-13 DIAGNOSIS — Z8262 Family history of osteoporosis: Secondary | ICD-10-CM | POA: Diagnosis not present

## 2015-07-13 DIAGNOSIS — E28319 Asymptomatic premature menopause: Secondary | ICD-10-CM | POA: Diagnosis not present

## 2015-07-13 DIAGNOSIS — E559 Vitamin D deficiency, unspecified: Secondary | ICD-10-CM | POA: Diagnosis not present

## 2015-07-13 DIAGNOSIS — Z8489 Family history of other specified conditions: Secondary | ICD-10-CM | POA: Diagnosis not present

## 2015-07-13 DIAGNOSIS — Z79899 Other long term (current) drug therapy: Secondary | ICD-10-CM | POA: Diagnosis not present

## 2015-07-13 DIAGNOSIS — Z8781 Personal history of (healed) traumatic fracture: Secondary | ICD-10-CM | POA: Diagnosis not present

## 2015-07-13 DIAGNOSIS — M810 Age-related osteoporosis without current pathological fracture: Secondary | ICD-10-CM | POA: Diagnosis not present

## 2015-07-13 DIAGNOSIS — Z87891 Personal history of nicotine dependence: Secondary | ICD-10-CM | POA: Diagnosis not present

## 2015-08-08 DIAGNOSIS — G8929 Other chronic pain: Secondary | ICD-10-CM | POA: Diagnosis not present

## 2015-08-08 DIAGNOSIS — M542 Cervicalgia: Secondary | ICD-10-CM | POA: Diagnosis not present

## 2015-08-08 DIAGNOSIS — M545 Low back pain: Secondary | ICD-10-CM | POA: Diagnosis not present

## 2015-08-09 DIAGNOSIS — J449 Chronic obstructive pulmonary disease, unspecified: Secondary | ICD-10-CM | POA: Diagnosis not present

## 2015-08-09 DIAGNOSIS — J069 Acute upper respiratory infection, unspecified: Secondary | ICD-10-CM | POA: Diagnosis not present

## 2015-08-09 DIAGNOSIS — J45909 Unspecified asthma, uncomplicated: Secondary | ICD-10-CM | POA: Diagnosis not present

## 2015-08-28 DIAGNOSIS — E538 Deficiency of other specified B group vitamins: Secondary | ICD-10-CM | POA: Diagnosis not present

## 2015-08-29 DIAGNOSIS — J309 Allergic rhinitis, unspecified: Secondary | ICD-10-CM | POA: Diagnosis not present

## 2015-08-29 DIAGNOSIS — R5383 Other fatigue: Secondary | ICD-10-CM | POA: Diagnosis not present

## 2015-08-29 DIAGNOSIS — J449 Chronic obstructive pulmonary disease, unspecified: Secondary | ICD-10-CM | POA: Diagnosis not present

## 2015-08-29 DIAGNOSIS — F411 Generalized anxiety disorder: Secondary | ICD-10-CM | POA: Diagnosis not present

## 2015-08-30 DIAGNOSIS — M25561 Pain in right knee: Secondary | ICD-10-CM | POA: Diagnosis not present

## 2015-08-30 DIAGNOSIS — M1711 Unilateral primary osteoarthritis, right knee: Secondary | ICD-10-CM | POA: Diagnosis not present

## 2015-09-03 DIAGNOSIS — R200 Anesthesia of skin: Secondary | ICD-10-CM | POA: Diagnosis not present

## 2015-09-03 DIAGNOSIS — R202 Paresthesia of skin: Secondary | ICD-10-CM | POA: Diagnosis not present

## 2015-09-06 DIAGNOSIS — M545 Low back pain: Secondary | ICD-10-CM | POA: Diagnosis not present

## 2015-09-06 DIAGNOSIS — G8929 Other chronic pain: Secondary | ICD-10-CM | POA: Diagnosis not present

## 2015-09-06 DIAGNOSIS — M542 Cervicalgia: Secondary | ICD-10-CM | POA: Diagnosis not present

## 2015-09-07 DIAGNOSIS — M810 Age-related osteoporosis without current pathological fracture: Secondary | ICD-10-CM | POA: Insufficient documentation

## 2015-09-07 DIAGNOSIS — Z8781 Personal history of (healed) traumatic fracture: Secondary | ICD-10-CM | POA: Diagnosis not present

## 2015-09-07 DIAGNOSIS — Z8262 Family history of osteoporosis: Secondary | ICD-10-CM | POA: Diagnosis not present

## 2015-09-07 DIAGNOSIS — E559 Vitamin D deficiency, unspecified: Secondary | ICD-10-CM

## 2015-09-07 DIAGNOSIS — E28319 Asymptomatic premature menopause: Secondary | ICD-10-CM | POA: Diagnosis not present

## 2015-09-07 DIAGNOSIS — Z79899 Other long term (current) drug therapy: Secondary | ICD-10-CM | POA: Diagnosis not present

## 2015-09-07 DIAGNOSIS — Z87891 Personal history of nicotine dependence: Secondary | ICD-10-CM | POA: Diagnosis not present

## 2015-09-07 HISTORY — DX: Vitamin D deficiency, unspecified: E55.9

## 2015-09-07 HISTORY — DX: Age-related osteoporosis without current pathological fracture: M81.0

## 2015-09-13 DIAGNOSIS — M5416 Radiculopathy, lumbar region: Secondary | ICD-10-CM | POA: Diagnosis not present

## 2015-09-25 DIAGNOSIS — E538 Deficiency of other specified B group vitamins: Secondary | ICD-10-CM | POA: Diagnosis not present

## 2015-10-03 DIAGNOSIS — M545 Low back pain: Secondary | ICD-10-CM | POA: Diagnosis not present

## 2015-10-03 DIAGNOSIS — G8929 Other chronic pain: Secondary | ICD-10-CM | POA: Diagnosis not present

## 2015-10-03 DIAGNOSIS — M542 Cervicalgia: Secondary | ICD-10-CM | POA: Diagnosis not present

## 2015-10-04 DIAGNOSIS — G629 Polyneuropathy, unspecified: Secondary | ICD-10-CM | POA: Diagnosis not present

## 2015-10-09 DIAGNOSIS — R7309 Other abnormal glucose: Secondary | ICD-10-CM | POA: Diagnosis not present

## 2015-10-09 DIAGNOSIS — G629 Polyneuropathy, unspecified: Secondary | ICD-10-CM | POA: Diagnosis not present

## 2015-10-15 DIAGNOSIS — H3321 Serous retinal detachment, right eye: Secondary | ICD-10-CM | POA: Diagnosis not present

## 2015-10-26 DIAGNOSIS — R110 Nausea: Secondary | ICD-10-CM | POA: Diagnosis not present

## 2015-10-26 DIAGNOSIS — E538 Deficiency of other specified B group vitamins: Secondary | ICD-10-CM | POA: Diagnosis not present

## 2015-10-26 DIAGNOSIS — J441 Chronic obstructive pulmonary disease with (acute) exacerbation: Secondary | ICD-10-CM | POA: Diagnosis not present

## 2015-10-26 DIAGNOSIS — E663 Overweight: Secondary | ICD-10-CM | POA: Diagnosis not present

## 2015-10-26 DIAGNOSIS — R6889 Other general symptoms and signs: Secondary | ICD-10-CM | POA: Diagnosis not present

## 2015-10-26 DIAGNOSIS — Z1389 Encounter for screening for other disorder: Secondary | ICD-10-CM | POA: Diagnosis not present

## 2015-10-26 DIAGNOSIS — Z6828 Body mass index (BMI) 28.0-28.9, adult: Secondary | ICD-10-CM | POA: Diagnosis not present

## 2015-10-26 DIAGNOSIS — J069 Acute upper respiratory infection, unspecified: Secondary | ICD-10-CM | POA: Diagnosis not present

## 2015-10-30 DIAGNOSIS — Z9981 Dependence on supplemental oxygen: Secondary | ICD-10-CM | POA: Diagnosis not present

## 2015-10-30 DIAGNOSIS — J441 Chronic obstructive pulmonary disease with (acute) exacerbation: Secondary | ICD-10-CM | POA: Diagnosis not present

## 2015-10-30 DIAGNOSIS — F329 Major depressive disorder, single episode, unspecified: Secondary | ICD-10-CM | POA: Diagnosis not present

## 2015-10-30 DIAGNOSIS — E663 Overweight: Secondary | ICD-10-CM | POA: Diagnosis not present

## 2015-10-30 DIAGNOSIS — Z6828 Body mass index (BMI) 28.0-28.9, adult: Secondary | ICD-10-CM | POA: Diagnosis not present

## 2015-11-02 DIAGNOSIS — M5416 Radiculopathy, lumbar region: Secondary | ICD-10-CM | POA: Diagnosis not present

## 2015-11-02 DIAGNOSIS — G629 Polyneuropathy, unspecified: Secondary | ICD-10-CM | POA: Diagnosis not present

## 2015-11-05 DIAGNOSIS — S6000XA Contusion of unspecified finger without damage to nail, initial encounter: Secondary | ICD-10-CM | POA: Diagnosis not present

## 2015-11-05 DIAGNOSIS — J441 Chronic obstructive pulmonary disease with (acute) exacerbation: Secondary | ICD-10-CM | POA: Diagnosis not present

## 2015-11-05 DIAGNOSIS — Z6828 Body mass index (BMI) 28.0-28.9, adult: Secondary | ICD-10-CM | POA: Diagnosis not present

## 2015-11-05 DIAGNOSIS — B3781 Candidal esophagitis: Secondary | ICD-10-CM | POA: Diagnosis not present

## 2015-11-05 DIAGNOSIS — E663 Overweight: Secondary | ICD-10-CM | POA: Diagnosis not present

## 2015-11-06 DIAGNOSIS — M5416 Radiculopathy, lumbar region: Secondary | ICD-10-CM | POA: Diagnosis not present

## 2015-11-06 DIAGNOSIS — M47896 Other spondylosis, lumbar region: Secondary | ICD-10-CM | POA: Diagnosis not present

## 2015-11-06 DIAGNOSIS — M545 Low back pain: Secondary | ICD-10-CM | POA: Diagnosis not present

## 2015-11-14 DIAGNOSIS — M4696 Unspecified inflammatory spondylopathy, lumbar region: Secondary | ICD-10-CM | POA: Diagnosis not present

## 2015-11-14 DIAGNOSIS — M5416 Radiculopathy, lumbar region: Secondary | ICD-10-CM | POA: Diagnosis not present

## 2015-11-14 DIAGNOSIS — G629 Polyneuropathy, unspecified: Secondary | ICD-10-CM | POA: Diagnosis not present

## 2015-11-21 DIAGNOSIS — R5383 Other fatigue: Secondary | ICD-10-CM | POA: Diagnosis not present

## 2015-11-21 DIAGNOSIS — F411 Generalized anxiety disorder: Secondary | ICD-10-CM | POA: Diagnosis not present

## 2015-11-21 DIAGNOSIS — J301 Allergic rhinitis due to pollen: Secondary | ICD-10-CM | POA: Diagnosis not present

## 2015-11-21 DIAGNOSIS — J449 Chronic obstructive pulmonary disease, unspecified: Secondary | ICD-10-CM | POA: Diagnosis not present

## 2015-11-23 DIAGNOSIS — E538 Deficiency of other specified B group vitamins: Secondary | ICD-10-CM | POA: Diagnosis not present

## 2015-11-27 DIAGNOSIS — H15832 Staphyloma posticum, left eye: Secondary | ICD-10-CM | POA: Diagnosis not present

## 2015-11-27 DIAGNOSIS — H43392 Other vitreous opacities, left eye: Secondary | ICD-10-CM | POA: Diagnosis not present

## 2015-11-27 DIAGNOSIS — H35372 Puckering of macula, left eye: Secondary | ICD-10-CM | POA: Diagnosis not present

## 2015-11-27 DIAGNOSIS — H43822 Vitreomacular adhesion, left eye: Secondary | ICD-10-CM | POA: Diagnosis not present

## 2015-12-10 DIAGNOSIS — H35372 Puckering of macula, left eye: Secondary | ICD-10-CM | POA: Diagnosis not present

## 2015-12-10 DIAGNOSIS — H35371 Puckering of macula, right eye: Secondary | ICD-10-CM | POA: Diagnosis not present

## 2015-12-10 DIAGNOSIS — H35412 Lattice degeneration of retina, left eye: Secondary | ICD-10-CM | POA: Diagnosis not present

## 2015-12-11 DIAGNOSIS — H15832 Staphyloma posticum, left eye: Secondary | ICD-10-CM | POA: Diagnosis not present

## 2015-12-11 DIAGNOSIS — H35372 Puckering of macula, left eye: Secondary | ICD-10-CM | POA: Diagnosis not present

## 2015-12-11 DIAGNOSIS — H43822 Vitreomacular adhesion, left eye: Secondary | ICD-10-CM | POA: Diagnosis not present

## 2015-12-18 DIAGNOSIS — H35372 Puckering of macula, left eye: Secondary | ICD-10-CM | POA: Diagnosis not present

## 2015-12-25 DIAGNOSIS — E538 Deficiency of other specified B group vitamins: Secondary | ICD-10-CM | POA: Diagnosis not present

## 2015-12-26 DIAGNOSIS — Z9119 Patient's noncompliance with other medical treatment and regimen: Secondary | ICD-10-CM | POA: Diagnosis not present

## 2015-12-26 DIAGNOSIS — M545 Low back pain: Secondary | ICD-10-CM | POA: Diagnosis not present

## 2015-12-26 DIAGNOSIS — M542 Cervicalgia: Secondary | ICD-10-CM | POA: Diagnosis not present

## 2015-12-26 DIAGNOSIS — G8929 Other chronic pain: Secondary | ICD-10-CM | POA: Diagnosis not present

## 2016-01-10 DIAGNOSIS — M5136 Other intervertebral disc degeneration, lumbar region: Secondary | ICD-10-CM | POA: Insufficient documentation

## 2016-01-10 DIAGNOSIS — M47816 Spondylosis without myelopathy or radiculopathy, lumbar region: Secondary | ICD-10-CM

## 2016-01-10 DIAGNOSIS — J418 Mixed simple and mucopurulent chronic bronchitis: Secondary | ICD-10-CM

## 2016-01-10 DIAGNOSIS — Z7901 Long term (current) use of anticoagulants: Secondary | ICD-10-CM

## 2016-01-10 DIAGNOSIS — M51369 Other intervertebral disc degeneration, lumbar region without mention of lumbar back pain or lower extremity pain: Secondary | ICD-10-CM

## 2016-01-10 DIAGNOSIS — M4696 Unspecified inflammatory spondylopathy, lumbar region: Secondary | ICD-10-CM | POA: Diagnosis not present

## 2016-01-10 DIAGNOSIS — M5416 Radiculopathy, lumbar region: Secondary | ICD-10-CM | POA: Diagnosis not present

## 2016-01-10 HISTORY — DX: Other intervertebral disc degeneration, lumbar region: M51.36

## 2016-01-10 HISTORY — DX: Long term (current) use of anticoagulants: Z79.01

## 2016-01-10 HISTORY — DX: Other intervertebral disc degeneration, lumbar region without mention of lumbar back pain or lower extremity pain: M51.369

## 2016-01-10 HISTORY — DX: Spondylosis without myelopathy or radiculopathy, lumbar region: M47.816

## 2016-01-10 HISTORY — DX: Mixed simple and mucopurulent chronic bronchitis: J41.8

## 2016-01-11 DIAGNOSIS — H35372 Puckering of macula, left eye: Secondary | ICD-10-CM | POA: Diagnosis not present

## 2016-01-23 DIAGNOSIS — J209 Acute bronchitis, unspecified: Secondary | ICD-10-CM | POA: Diagnosis not present

## 2016-02-18 DIAGNOSIS — M25572 Pain in left ankle and joints of left foot: Secondary | ICD-10-CM | POA: Diagnosis not present

## 2016-02-19 DIAGNOSIS — M542 Cervicalgia: Secondary | ICD-10-CM | POA: Diagnosis not present

## 2016-02-19 DIAGNOSIS — Z9119 Patient's noncompliance with other medical treatment and regimen: Secondary | ICD-10-CM | POA: Diagnosis not present

## 2016-02-19 DIAGNOSIS — G8929 Other chronic pain: Secondary | ICD-10-CM | POA: Diagnosis not present

## 2016-02-27 DIAGNOSIS — R55 Syncope and collapse: Secondary | ICD-10-CM | POA: Diagnosis not present

## 2016-02-27 DIAGNOSIS — R197 Diarrhea, unspecified: Secondary | ICD-10-CM

## 2016-02-27 DIAGNOSIS — J440 Chronic obstructive pulmonary disease with acute lower respiratory infection: Secondary | ICD-10-CM | POA: Diagnosis not present

## 2016-02-27 DIAGNOSIS — R52 Pain, unspecified: Secondary | ICD-10-CM | POA: Insufficient documentation

## 2016-02-27 DIAGNOSIS — A09 Infectious gastroenteritis and colitis, unspecified: Secondary | ICD-10-CM | POA: Diagnosis not present

## 2016-02-27 DIAGNOSIS — R945 Abnormal results of liver function studies: Secondary | ICD-10-CM | POA: Diagnosis not present

## 2016-02-27 DIAGNOSIS — G8929 Other chronic pain: Secondary | ICD-10-CM | POA: Insufficient documentation

## 2016-02-27 DIAGNOSIS — R079 Chest pain, unspecified: Secondary | ICD-10-CM | POA: Diagnosis not present

## 2016-02-27 DIAGNOSIS — F329 Major depressive disorder, single episode, unspecified: Secondary | ICD-10-CM | POA: Diagnosis not present

## 2016-02-27 DIAGNOSIS — E860 Dehydration: Secondary | ICD-10-CM

## 2016-02-27 DIAGNOSIS — D72829 Elevated white blood cell count, unspecified: Secondary | ICD-10-CM | POA: Diagnosis not present

## 2016-02-27 DIAGNOSIS — R29898 Other symptoms and signs involving the musculoskeletal system: Secondary | ICD-10-CM | POA: Insufficient documentation

## 2016-02-27 DIAGNOSIS — R531 Weakness: Secondary | ICD-10-CM | POA: Diagnosis not present

## 2016-02-27 DIAGNOSIS — I10 Essential (primary) hypertension: Secondary | ICD-10-CM

## 2016-02-27 DIAGNOSIS — F419 Anxiety disorder, unspecified: Secondary | ICD-10-CM | POA: Diagnosis not present

## 2016-02-27 DIAGNOSIS — M6282 Rhabdomyolysis: Secondary | ICD-10-CM

## 2016-02-27 DIAGNOSIS — T148 Other injury of unspecified body region: Secondary | ICD-10-CM | POA: Diagnosis not present

## 2016-02-27 DIAGNOSIS — M549 Dorsalgia, unspecified: Secondary | ICD-10-CM | POA: Diagnosis not present

## 2016-02-27 DIAGNOSIS — M810 Age-related osteoporosis without current pathological fracture: Secondary | ICD-10-CM | POA: Diagnosis present

## 2016-02-27 DIAGNOSIS — F32A Depression, unspecified: Secondary | ICD-10-CM | POA: Insufficient documentation

## 2016-02-27 DIAGNOSIS — E785 Hyperlipidemia, unspecified: Secondary | ICD-10-CM | POA: Diagnosis present

## 2016-02-27 DIAGNOSIS — R05 Cough: Secondary | ICD-10-CM | POA: Diagnosis not present

## 2016-02-27 DIAGNOSIS — R0602 Shortness of breath: Secondary | ICD-10-CM | POA: Diagnosis not present

## 2016-02-27 DIAGNOSIS — I6523 Occlusion and stenosis of bilateral carotid arteries: Secondary | ICD-10-CM | POA: Diagnosis not present

## 2016-02-27 DIAGNOSIS — Z8673 Personal history of transient ischemic attack (TIA), and cerebral infarction without residual deficits: Secondary | ICD-10-CM | POA: Diagnosis not present

## 2016-02-27 DIAGNOSIS — E039 Hypothyroidism, unspecified: Secondary | ICD-10-CM

## 2016-02-27 DIAGNOSIS — J449 Chronic obstructive pulmonary disease, unspecified: Secondary | ICD-10-CM

## 2016-02-27 DIAGNOSIS — Z87891 Personal history of nicotine dependence: Secondary | ICD-10-CM | POA: Diagnosis not present

## 2016-02-27 DIAGNOSIS — K429 Umbilical hernia without obstruction or gangrene: Secondary | ICD-10-CM | POA: Diagnosis not present

## 2016-02-27 DIAGNOSIS — G894 Chronic pain syndrome: Secondary | ICD-10-CM | POA: Diagnosis not present

## 2016-02-27 DIAGNOSIS — J189 Pneumonia, unspecified organism: Secondary | ICD-10-CM | POA: Diagnosis present

## 2016-02-27 HISTORY — DX: Pain, unspecified: R52

## 2016-02-27 HISTORY — DX: Dorsalgia, unspecified: M54.9

## 2016-02-27 HISTORY — DX: Hypothyroidism, unspecified: E03.9

## 2016-02-27 HISTORY — DX: Syncope and collapse: R55

## 2016-02-27 HISTORY — DX: Anxiety disorder, unspecified: F41.9

## 2016-02-27 HISTORY — DX: Other chronic pain: G89.29

## 2016-02-27 HISTORY — DX: Essential (primary) hypertension: I10

## 2016-02-27 HISTORY — DX: Dehydration: E86.0

## 2016-02-27 HISTORY — DX: Chronic obstructive pulmonary disease, unspecified: J44.9

## 2016-02-27 HISTORY — DX: Diarrhea, unspecified: R19.7

## 2016-02-27 HISTORY — DX: Rhabdomyolysis: M62.82

## 2016-02-27 HISTORY — DX: Depression, unspecified: F32.A

## 2016-02-27 HISTORY — DX: Other symptoms and signs involving the musculoskeletal system: R29.898

## 2016-02-28 DIAGNOSIS — R7989 Other specified abnormal findings of blood chemistry: Secondary | ICD-10-CM

## 2016-02-28 DIAGNOSIS — D72829 Elevated white blood cell count, unspecified: Secondary | ICD-10-CM | POA: Insufficient documentation

## 2016-02-28 HISTORY — DX: Elevated white blood cell count, unspecified: D72.829

## 2016-02-28 HISTORY — DX: Other specified abnormal findings of blood chemistry: R79.89

## 2016-02-29 DIAGNOSIS — J189 Pneumonia, unspecified organism: Secondary | ICD-10-CM

## 2016-02-29 HISTORY — DX: Pneumonia, unspecified organism: J18.9

## 2016-03-03 DIAGNOSIS — M810 Age-related osteoporosis without current pathological fracture: Secondary | ICD-10-CM | POA: Diagnosis not present

## 2016-03-03 DIAGNOSIS — M6281 Muscle weakness (generalized): Secondary | ICD-10-CM | POA: Diagnosis not present

## 2016-03-03 DIAGNOSIS — J189 Pneumonia, unspecified organism: Secondary | ICD-10-CM | POA: Diagnosis not present

## 2016-03-03 DIAGNOSIS — J440 Chronic obstructive pulmonary disease with acute lower respiratory infection: Secondary | ICD-10-CM | POA: Diagnosis not present

## 2016-03-03 DIAGNOSIS — F329 Major depressive disorder, single episode, unspecified: Secondary | ICD-10-CM | POA: Diagnosis not present

## 2016-03-03 DIAGNOSIS — M6282 Rhabdomyolysis: Secondary | ICD-10-CM | POA: Diagnosis not present

## 2016-03-03 DIAGNOSIS — I10 Essential (primary) hypertension: Secondary | ICD-10-CM | POA: Diagnosis not present

## 2016-03-03 DIAGNOSIS — Z7902 Long term (current) use of antithrombotics/antiplatelets: Secondary | ICD-10-CM | POA: Diagnosis not present

## 2016-03-03 DIAGNOSIS — R279 Unspecified lack of coordination: Secondary | ICD-10-CM | POA: Diagnosis not present

## 2016-03-03 DIAGNOSIS — F419 Anxiety disorder, unspecified: Secondary | ICD-10-CM | POA: Diagnosis not present

## 2016-03-03 DIAGNOSIS — G894 Chronic pain syndrome: Secondary | ICD-10-CM | POA: Diagnosis not present

## 2016-03-03 DIAGNOSIS — Z9981 Dependence on supplemental oxygen: Secondary | ICD-10-CM | POA: Diagnosis not present

## 2016-03-03 DIAGNOSIS — M545 Low back pain: Secondary | ICD-10-CM | POA: Diagnosis not present

## 2016-03-03 DIAGNOSIS — Z8673 Personal history of transient ischemic attack (TIA), and cerebral infarction without residual deficits: Secondary | ICD-10-CM | POA: Diagnosis not present

## 2016-03-04 DIAGNOSIS — F329 Major depressive disorder, single episode, unspecified: Secondary | ICD-10-CM | POA: Diagnosis not present

## 2016-03-04 DIAGNOSIS — J189 Pneumonia, unspecified organism: Secondary | ICD-10-CM | POA: Diagnosis not present

## 2016-03-04 DIAGNOSIS — J440 Chronic obstructive pulmonary disease with acute lower respiratory infection: Secondary | ICD-10-CM | POA: Diagnosis not present

## 2016-03-04 DIAGNOSIS — G894 Chronic pain syndrome: Secondary | ICD-10-CM | POA: Diagnosis not present

## 2016-03-04 DIAGNOSIS — M6282 Rhabdomyolysis: Secondary | ICD-10-CM | POA: Diagnosis not present

## 2016-03-04 DIAGNOSIS — I10 Essential (primary) hypertension: Secondary | ICD-10-CM | POA: Diagnosis not present

## 2016-03-05 DIAGNOSIS — J440 Chronic obstructive pulmonary disease with acute lower respiratory infection: Secondary | ICD-10-CM | POA: Diagnosis not present

## 2016-03-05 DIAGNOSIS — M6282 Rhabdomyolysis: Secondary | ICD-10-CM | POA: Diagnosis not present

## 2016-03-05 DIAGNOSIS — F329 Major depressive disorder, single episode, unspecified: Secondary | ICD-10-CM | POA: Diagnosis not present

## 2016-03-05 DIAGNOSIS — G894 Chronic pain syndrome: Secondary | ICD-10-CM | POA: Diagnosis not present

## 2016-03-05 DIAGNOSIS — J189 Pneumonia, unspecified organism: Secondary | ICD-10-CM | POA: Diagnosis not present

## 2016-03-05 DIAGNOSIS — I10 Essential (primary) hypertension: Secondary | ICD-10-CM | POA: Diagnosis not present

## 2016-03-06 DIAGNOSIS — J189 Pneumonia, unspecified organism: Secondary | ICD-10-CM | POA: Diagnosis not present

## 2016-03-06 DIAGNOSIS — G894 Chronic pain syndrome: Secondary | ICD-10-CM | POA: Diagnosis not present

## 2016-03-06 DIAGNOSIS — M6282 Rhabdomyolysis: Secondary | ICD-10-CM | POA: Diagnosis not present

## 2016-03-06 DIAGNOSIS — F329 Major depressive disorder, single episode, unspecified: Secondary | ICD-10-CM | POA: Diagnosis not present

## 2016-03-06 DIAGNOSIS — I10 Essential (primary) hypertension: Secondary | ICD-10-CM | POA: Diagnosis not present

## 2016-03-06 DIAGNOSIS — J440 Chronic obstructive pulmonary disease with acute lower respiratory infection: Secondary | ICD-10-CM | POA: Diagnosis not present

## 2016-03-07 DIAGNOSIS — J189 Pneumonia, unspecified organism: Secondary | ICD-10-CM | POA: Diagnosis not present

## 2016-03-07 DIAGNOSIS — M6282 Rhabdomyolysis: Secondary | ICD-10-CM | POA: Diagnosis not present

## 2016-03-07 DIAGNOSIS — I10 Essential (primary) hypertension: Secondary | ICD-10-CM | POA: Diagnosis not present

## 2016-03-07 DIAGNOSIS — F329 Major depressive disorder, single episode, unspecified: Secondary | ICD-10-CM | POA: Diagnosis not present

## 2016-03-07 DIAGNOSIS — G894 Chronic pain syndrome: Secondary | ICD-10-CM | POA: Diagnosis not present

## 2016-03-07 DIAGNOSIS — J440 Chronic obstructive pulmonary disease with acute lower respiratory infection: Secondary | ICD-10-CM | POA: Diagnosis not present

## 2016-03-11 DIAGNOSIS — E663 Overweight: Secondary | ICD-10-CM | POA: Diagnosis not present

## 2016-03-11 DIAGNOSIS — E039 Hypothyroidism, unspecified: Secondary | ICD-10-CM | POA: Diagnosis not present

## 2016-03-11 DIAGNOSIS — E538 Deficiency of other specified B group vitamins: Secondary | ICD-10-CM | POA: Diagnosis not present

## 2016-03-11 DIAGNOSIS — Z79899 Other long term (current) drug therapy: Secondary | ICD-10-CM | POA: Diagnosis not present

## 2016-03-11 DIAGNOSIS — I10 Essential (primary) hypertension: Secondary | ICD-10-CM | POA: Diagnosis not present

## 2016-03-11 DIAGNOSIS — Z6828 Body mass index (BMI) 28.0-28.9, adult: Secondary | ICD-10-CM | POA: Diagnosis not present

## 2016-03-11 DIAGNOSIS — Z09 Encounter for follow-up examination after completed treatment for conditions other than malignant neoplasm: Secondary | ICD-10-CM | POA: Diagnosis not present

## 2016-03-12 DIAGNOSIS — M6282 Rhabdomyolysis: Secondary | ICD-10-CM | POA: Diagnosis not present

## 2016-03-12 DIAGNOSIS — J440 Chronic obstructive pulmonary disease with acute lower respiratory infection: Secondary | ICD-10-CM | POA: Diagnosis not present

## 2016-03-12 DIAGNOSIS — G894 Chronic pain syndrome: Secondary | ICD-10-CM | POA: Diagnosis not present

## 2016-03-12 DIAGNOSIS — I10 Essential (primary) hypertension: Secondary | ICD-10-CM | POA: Diagnosis not present

## 2016-03-12 DIAGNOSIS — J189 Pneumonia, unspecified organism: Secondary | ICD-10-CM | POA: Diagnosis not present

## 2016-03-12 DIAGNOSIS — F329 Major depressive disorder, single episode, unspecified: Secondary | ICD-10-CM | POA: Diagnosis not present

## 2016-03-13 DIAGNOSIS — I10 Essential (primary) hypertension: Secondary | ICD-10-CM | POA: Diagnosis not present

## 2016-03-13 DIAGNOSIS — M6282 Rhabdomyolysis: Secondary | ICD-10-CM | POA: Diagnosis not present

## 2016-03-13 DIAGNOSIS — J440 Chronic obstructive pulmonary disease with acute lower respiratory infection: Secondary | ICD-10-CM | POA: Diagnosis not present

## 2016-03-13 DIAGNOSIS — G894 Chronic pain syndrome: Secondary | ICD-10-CM | POA: Diagnosis not present

## 2016-03-13 DIAGNOSIS — J189 Pneumonia, unspecified organism: Secondary | ICD-10-CM | POA: Diagnosis not present

## 2016-03-13 DIAGNOSIS — F329 Major depressive disorder, single episode, unspecified: Secondary | ICD-10-CM | POA: Diagnosis not present

## 2016-03-14 DIAGNOSIS — M6282 Rhabdomyolysis: Secondary | ICD-10-CM | POA: Diagnosis not present

## 2016-03-14 DIAGNOSIS — G894 Chronic pain syndrome: Secondary | ICD-10-CM | POA: Diagnosis not present

## 2016-03-14 DIAGNOSIS — F329 Major depressive disorder, single episode, unspecified: Secondary | ICD-10-CM | POA: Diagnosis not present

## 2016-03-14 DIAGNOSIS — J440 Chronic obstructive pulmonary disease with acute lower respiratory infection: Secondary | ICD-10-CM | POA: Diagnosis not present

## 2016-03-14 DIAGNOSIS — I10 Essential (primary) hypertension: Secondary | ICD-10-CM | POA: Diagnosis not present

## 2016-03-14 DIAGNOSIS — J189 Pneumonia, unspecified organism: Secondary | ICD-10-CM | POA: Diagnosis not present

## 2016-03-18 DIAGNOSIS — F329 Major depressive disorder, single episode, unspecified: Secondary | ICD-10-CM | POA: Diagnosis not present

## 2016-03-18 DIAGNOSIS — I10 Essential (primary) hypertension: Secondary | ICD-10-CM | POA: Diagnosis not present

## 2016-03-18 DIAGNOSIS — J440 Chronic obstructive pulmonary disease with acute lower respiratory infection: Secondary | ICD-10-CM | POA: Diagnosis not present

## 2016-03-18 DIAGNOSIS — J189 Pneumonia, unspecified organism: Secondary | ICD-10-CM | POA: Diagnosis not present

## 2016-03-18 DIAGNOSIS — M6282 Rhabdomyolysis: Secondary | ICD-10-CM | POA: Diagnosis not present

## 2016-03-18 DIAGNOSIS — G894 Chronic pain syndrome: Secondary | ICD-10-CM | POA: Diagnosis not present

## 2016-03-20 DIAGNOSIS — M6282 Rhabdomyolysis: Secondary | ICD-10-CM | POA: Diagnosis not present

## 2016-03-20 DIAGNOSIS — G894 Chronic pain syndrome: Secondary | ICD-10-CM | POA: Diagnosis not present

## 2016-03-20 DIAGNOSIS — F329 Major depressive disorder, single episode, unspecified: Secondary | ICD-10-CM | POA: Diagnosis not present

## 2016-03-20 DIAGNOSIS — I10 Essential (primary) hypertension: Secondary | ICD-10-CM | POA: Diagnosis not present

## 2016-03-20 DIAGNOSIS — J440 Chronic obstructive pulmonary disease with acute lower respiratory infection: Secondary | ICD-10-CM | POA: Diagnosis not present

## 2016-03-20 DIAGNOSIS — J189 Pneumonia, unspecified organism: Secondary | ICD-10-CM | POA: Diagnosis not present

## 2016-03-24 DIAGNOSIS — M1711 Unilateral primary osteoarthritis, right knee: Secondary | ICD-10-CM | POA: Diagnosis not present

## 2016-03-24 DIAGNOSIS — M542 Cervicalgia: Secondary | ICD-10-CM | POA: Diagnosis not present

## 2016-03-24 DIAGNOSIS — J440 Chronic obstructive pulmonary disease with acute lower respiratory infection: Secondary | ICD-10-CM | POA: Diagnosis not present

## 2016-03-24 DIAGNOSIS — F329 Major depressive disorder, single episode, unspecified: Secondary | ICD-10-CM | POA: Diagnosis not present

## 2016-03-24 DIAGNOSIS — M6282 Rhabdomyolysis: Secondary | ICD-10-CM | POA: Diagnosis not present

## 2016-03-24 DIAGNOSIS — I10 Essential (primary) hypertension: Secondary | ICD-10-CM | POA: Diagnosis not present

## 2016-03-24 DIAGNOSIS — J189 Pneumonia, unspecified organism: Secondary | ICD-10-CM | POA: Diagnosis not present

## 2016-03-24 DIAGNOSIS — G894 Chronic pain syndrome: Secondary | ICD-10-CM | POA: Diagnosis not present

## 2016-03-24 DIAGNOSIS — G8929 Other chronic pain: Secondary | ICD-10-CM | POA: Diagnosis not present

## 2016-03-25 DIAGNOSIS — I10 Essential (primary) hypertension: Secondary | ICD-10-CM | POA: Diagnosis not present

## 2016-03-25 DIAGNOSIS — G894 Chronic pain syndrome: Secondary | ICD-10-CM | POA: Diagnosis not present

## 2016-03-25 DIAGNOSIS — J189 Pneumonia, unspecified organism: Secondary | ICD-10-CM | POA: Diagnosis not present

## 2016-03-25 DIAGNOSIS — J440 Chronic obstructive pulmonary disease with acute lower respiratory infection: Secondary | ICD-10-CM | POA: Diagnosis not present

## 2016-03-25 DIAGNOSIS — M6282 Rhabdomyolysis: Secondary | ICD-10-CM | POA: Diagnosis not present

## 2016-03-25 DIAGNOSIS — F329 Major depressive disorder, single episode, unspecified: Secondary | ICD-10-CM | POA: Diagnosis not present

## 2016-03-26 DIAGNOSIS — F411 Generalized anxiety disorder: Secondary | ICD-10-CM | POA: Diagnosis not present

## 2016-03-26 DIAGNOSIS — R5383 Other fatigue: Secondary | ICD-10-CM | POA: Diagnosis not present

## 2016-03-26 DIAGNOSIS — J301 Allergic rhinitis due to pollen: Secondary | ICD-10-CM | POA: Diagnosis not present

## 2016-03-26 DIAGNOSIS — M6282 Rhabdomyolysis: Secondary | ICD-10-CM | POA: Diagnosis not present

## 2016-03-26 DIAGNOSIS — J449 Chronic obstructive pulmonary disease, unspecified: Secondary | ICD-10-CM | POA: Diagnosis not present

## 2016-03-26 DIAGNOSIS — M47812 Spondylosis without myelopathy or radiculopathy, cervical region: Secondary | ICD-10-CM | POA: Diagnosis not present

## 2016-03-27 DIAGNOSIS — M6282 Rhabdomyolysis: Secondary | ICD-10-CM | POA: Diagnosis not present

## 2016-03-27 DIAGNOSIS — G894 Chronic pain syndrome: Secondary | ICD-10-CM | POA: Diagnosis not present

## 2016-03-27 DIAGNOSIS — F329 Major depressive disorder, single episode, unspecified: Secondary | ICD-10-CM | POA: Diagnosis not present

## 2016-03-27 DIAGNOSIS — I10 Essential (primary) hypertension: Secondary | ICD-10-CM | POA: Diagnosis not present

## 2016-03-27 DIAGNOSIS — J440 Chronic obstructive pulmonary disease with acute lower respiratory infection: Secondary | ICD-10-CM | POA: Diagnosis not present

## 2016-03-27 DIAGNOSIS — J189 Pneumonia, unspecified organism: Secondary | ICD-10-CM | POA: Diagnosis not present

## 2016-03-31 DIAGNOSIS — F329 Major depressive disorder, single episode, unspecified: Secondary | ICD-10-CM | POA: Diagnosis not present

## 2016-03-31 DIAGNOSIS — J189 Pneumonia, unspecified organism: Secondary | ICD-10-CM | POA: Diagnosis not present

## 2016-03-31 DIAGNOSIS — M6282 Rhabdomyolysis: Secondary | ICD-10-CM | POA: Diagnosis not present

## 2016-03-31 DIAGNOSIS — J440 Chronic obstructive pulmonary disease with acute lower respiratory infection: Secondary | ICD-10-CM | POA: Diagnosis not present

## 2016-03-31 DIAGNOSIS — G894 Chronic pain syndrome: Secondary | ICD-10-CM | POA: Diagnosis not present

## 2016-03-31 DIAGNOSIS — I10 Essential (primary) hypertension: Secondary | ICD-10-CM | POA: Diagnosis not present

## 2016-04-02 DIAGNOSIS — R5383 Other fatigue: Secondary | ICD-10-CM | POA: Diagnosis not present

## 2016-04-02 DIAGNOSIS — R911 Solitary pulmonary nodule: Secondary | ICD-10-CM | POA: Diagnosis not present

## 2016-04-02 DIAGNOSIS — J449 Chronic obstructive pulmonary disease, unspecified: Secondary | ICD-10-CM | POA: Diagnosis not present

## 2016-04-02 DIAGNOSIS — J439 Emphysema, unspecified: Secondary | ICD-10-CM | POA: Diagnosis not present

## 2016-04-03 DIAGNOSIS — J189 Pneumonia, unspecified organism: Secondary | ICD-10-CM | POA: Diagnosis not present

## 2016-04-03 DIAGNOSIS — F329 Major depressive disorder, single episode, unspecified: Secondary | ICD-10-CM | POA: Diagnosis not present

## 2016-04-03 DIAGNOSIS — J440 Chronic obstructive pulmonary disease with acute lower respiratory infection: Secondary | ICD-10-CM | POA: Diagnosis not present

## 2016-04-03 DIAGNOSIS — G894 Chronic pain syndrome: Secondary | ICD-10-CM | POA: Diagnosis not present

## 2016-04-03 DIAGNOSIS — I10 Essential (primary) hypertension: Secondary | ICD-10-CM | POA: Diagnosis not present

## 2016-04-03 DIAGNOSIS — M6282 Rhabdomyolysis: Secondary | ICD-10-CM | POA: Diagnosis not present

## 2016-04-04 DIAGNOSIS — Z87891 Personal history of nicotine dependence: Secondary | ICD-10-CM | POA: Diagnosis not present

## 2016-04-04 DIAGNOSIS — Z8781 Personal history of (healed) traumatic fracture: Secondary | ICD-10-CM | POA: Diagnosis not present

## 2016-04-04 DIAGNOSIS — E559 Vitamin D deficiency, unspecified: Secondary | ICD-10-CM | POA: Diagnosis not present

## 2016-04-04 DIAGNOSIS — M818 Other osteoporosis without current pathological fracture: Secondary | ICD-10-CM | POA: Diagnosis not present

## 2016-04-04 DIAGNOSIS — Z1321 Encounter for screening for nutritional disorder: Secondary | ICD-10-CM | POA: Diagnosis not present

## 2016-04-04 DIAGNOSIS — Z8262 Family history of osteoporosis: Secondary | ICD-10-CM | POA: Diagnosis not present

## 2016-04-04 DIAGNOSIS — E28319 Asymptomatic premature menopause: Secondary | ICD-10-CM | POA: Diagnosis not present

## 2016-04-04 DIAGNOSIS — Z79899 Other long term (current) drug therapy: Secondary | ICD-10-CM | POA: Diagnosis not present

## 2016-04-04 DIAGNOSIS — Z8489 Family history of other specified conditions: Secondary | ICD-10-CM | POA: Diagnosis not present

## 2016-04-07 DIAGNOSIS — J440 Chronic obstructive pulmonary disease with acute lower respiratory infection: Secondary | ICD-10-CM | POA: Diagnosis not present

## 2016-04-07 DIAGNOSIS — G894 Chronic pain syndrome: Secondary | ICD-10-CM | POA: Diagnosis not present

## 2016-04-07 DIAGNOSIS — J189 Pneumonia, unspecified organism: Secondary | ICD-10-CM | POA: Diagnosis not present

## 2016-04-07 DIAGNOSIS — I10 Essential (primary) hypertension: Secondary | ICD-10-CM | POA: Diagnosis not present

## 2016-04-07 DIAGNOSIS — M6282 Rhabdomyolysis: Secondary | ICD-10-CM | POA: Diagnosis not present

## 2016-04-07 DIAGNOSIS — F329 Major depressive disorder, single episode, unspecified: Secondary | ICD-10-CM | POA: Diagnosis not present

## 2016-04-09 DIAGNOSIS — I10 Essential (primary) hypertension: Secondary | ICD-10-CM | POA: Diagnosis not present

## 2016-04-09 DIAGNOSIS — M6282 Rhabdomyolysis: Secondary | ICD-10-CM | POA: Diagnosis not present

## 2016-04-09 DIAGNOSIS — G894 Chronic pain syndrome: Secondary | ICD-10-CM | POA: Diagnosis not present

## 2016-04-09 DIAGNOSIS — F329 Major depressive disorder, single episode, unspecified: Secondary | ICD-10-CM | POA: Diagnosis not present

## 2016-04-09 DIAGNOSIS — J440 Chronic obstructive pulmonary disease with acute lower respiratory infection: Secondary | ICD-10-CM | POA: Diagnosis not present

## 2016-04-09 DIAGNOSIS — J189 Pneumonia, unspecified organism: Secondary | ICD-10-CM | POA: Diagnosis not present

## 2016-04-23 DIAGNOSIS — R911 Solitary pulmonary nodule: Secondary | ICD-10-CM | POA: Diagnosis not present

## 2016-04-23 DIAGNOSIS — J449 Chronic obstructive pulmonary disease, unspecified: Secondary | ICD-10-CM | POA: Diagnosis not present

## 2016-04-24 DIAGNOSIS — F329 Major depressive disorder, single episode, unspecified: Secondary | ICD-10-CM | POA: Diagnosis not present

## 2016-04-24 DIAGNOSIS — I10 Essential (primary) hypertension: Secondary | ICD-10-CM | POA: Diagnosis not present

## 2016-04-24 DIAGNOSIS — J440 Chronic obstructive pulmonary disease with acute lower respiratory infection: Secondary | ICD-10-CM | POA: Diagnosis not present

## 2016-04-24 DIAGNOSIS — J189 Pneumonia, unspecified organism: Secondary | ICD-10-CM | POA: Diagnosis not present

## 2016-04-24 DIAGNOSIS — M6282 Rhabdomyolysis: Secondary | ICD-10-CM | POA: Diagnosis not present

## 2016-04-24 DIAGNOSIS — G894 Chronic pain syndrome: Secondary | ICD-10-CM | POA: Diagnosis not present

## 2016-04-28 DIAGNOSIS — R5383 Other fatigue: Secondary | ICD-10-CM | POA: Diagnosis not present

## 2016-04-28 DIAGNOSIS — F411 Generalized anxiety disorder: Secondary | ICD-10-CM | POA: Diagnosis not present

## 2016-04-28 DIAGNOSIS — J449 Chronic obstructive pulmonary disease, unspecified: Secondary | ICD-10-CM | POA: Diagnosis not present

## 2016-04-28 DIAGNOSIS — J301 Allergic rhinitis due to pollen: Secondary | ICD-10-CM | POA: Diagnosis not present

## 2016-04-28 DIAGNOSIS — R918 Other nonspecific abnormal finding of lung field: Secondary | ICD-10-CM | POA: Diagnosis not present

## 2016-05-01 DIAGNOSIS — M6282 Rhabdomyolysis: Secondary | ICD-10-CM | POA: Diagnosis not present

## 2016-05-01 DIAGNOSIS — J440 Chronic obstructive pulmonary disease with acute lower respiratory infection: Secondary | ICD-10-CM | POA: Diagnosis not present

## 2016-05-01 DIAGNOSIS — J189 Pneumonia, unspecified organism: Secondary | ICD-10-CM | POA: Diagnosis not present

## 2016-05-01 DIAGNOSIS — I10 Essential (primary) hypertension: Secondary | ICD-10-CM | POA: Diagnosis not present

## 2016-05-01 DIAGNOSIS — G894 Chronic pain syndrome: Secondary | ICD-10-CM | POA: Diagnosis not present

## 2016-05-01 DIAGNOSIS — F329 Major depressive disorder, single episode, unspecified: Secondary | ICD-10-CM | POA: Diagnosis not present

## 2016-05-02 DIAGNOSIS — M545 Low back pain: Secondary | ICD-10-CM | POA: Diagnosis not present

## 2016-05-02 DIAGNOSIS — I10 Essential (primary) hypertension: Secondary | ICD-10-CM | POA: Diagnosis not present

## 2016-05-02 DIAGNOSIS — G894 Chronic pain syndrome: Secondary | ICD-10-CM | POA: Diagnosis not present

## 2016-05-02 DIAGNOSIS — J440 Chronic obstructive pulmonary disease with acute lower respiratory infection: Secondary | ICD-10-CM | POA: Diagnosis not present

## 2016-05-02 DIAGNOSIS — M6282 Rhabdomyolysis: Secondary | ICD-10-CM | POA: Diagnosis not present

## 2016-05-02 DIAGNOSIS — F329 Major depressive disorder, single episode, unspecified: Secondary | ICD-10-CM | POA: Diagnosis not present

## 2016-05-05 DIAGNOSIS — M6282 Rhabdomyolysis: Secondary | ICD-10-CM | POA: Diagnosis not present

## 2016-05-05 DIAGNOSIS — M545 Low back pain: Secondary | ICD-10-CM | POA: Diagnosis not present

## 2016-05-05 DIAGNOSIS — I10 Essential (primary) hypertension: Secondary | ICD-10-CM | POA: Diagnosis not present

## 2016-05-05 DIAGNOSIS — J440 Chronic obstructive pulmonary disease with acute lower respiratory infection: Secondary | ICD-10-CM | POA: Diagnosis not present

## 2016-05-05 DIAGNOSIS — F329 Major depressive disorder, single episode, unspecified: Secondary | ICD-10-CM | POA: Diagnosis not present

## 2016-05-05 DIAGNOSIS — G894 Chronic pain syndrome: Secondary | ICD-10-CM | POA: Diagnosis not present

## 2016-05-08 DIAGNOSIS — M6282 Rhabdomyolysis: Secondary | ICD-10-CM | POA: Diagnosis not present

## 2016-05-08 DIAGNOSIS — F329 Major depressive disorder, single episode, unspecified: Secondary | ICD-10-CM | POA: Diagnosis not present

## 2016-05-08 DIAGNOSIS — G894 Chronic pain syndrome: Secondary | ICD-10-CM | POA: Diagnosis not present

## 2016-05-08 DIAGNOSIS — J440 Chronic obstructive pulmonary disease with acute lower respiratory infection: Secondary | ICD-10-CM | POA: Diagnosis not present

## 2016-05-08 DIAGNOSIS — M545 Low back pain: Secondary | ICD-10-CM | POA: Diagnosis not present

## 2016-05-08 DIAGNOSIS — I10 Essential (primary) hypertension: Secondary | ICD-10-CM | POA: Diagnosis not present

## 2016-05-13 DIAGNOSIS — J440 Chronic obstructive pulmonary disease with acute lower respiratory infection: Secondary | ICD-10-CM | POA: Diagnosis not present

## 2016-05-13 DIAGNOSIS — I10 Essential (primary) hypertension: Secondary | ICD-10-CM | POA: Diagnosis not present

## 2016-05-13 DIAGNOSIS — G894 Chronic pain syndrome: Secondary | ICD-10-CM | POA: Diagnosis not present

## 2016-05-13 DIAGNOSIS — F329 Major depressive disorder, single episode, unspecified: Secondary | ICD-10-CM | POA: Diagnosis not present

## 2016-05-13 DIAGNOSIS — M6282 Rhabdomyolysis: Secondary | ICD-10-CM | POA: Diagnosis not present

## 2016-05-13 DIAGNOSIS — M545 Low back pain: Secondary | ICD-10-CM | POA: Diagnosis not present

## 2016-05-15 DIAGNOSIS — G894 Chronic pain syndrome: Secondary | ICD-10-CM | POA: Diagnosis not present

## 2016-05-15 DIAGNOSIS — F329 Major depressive disorder, single episode, unspecified: Secondary | ICD-10-CM | POA: Diagnosis not present

## 2016-05-15 DIAGNOSIS — M545 Low back pain: Secondary | ICD-10-CM | POA: Diagnosis not present

## 2016-05-15 DIAGNOSIS — M6282 Rhabdomyolysis: Secondary | ICD-10-CM | POA: Diagnosis not present

## 2016-05-15 DIAGNOSIS — J440 Chronic obstructive pulmonary disease with acute lower respiratory infection: Secondary | ICD-10-CM | POA: Diagnosis not present

## 2016-05-15 DIAGNOSIS — I10 Essential (primary) hypertension: Secondary | ICD-10-CM | POA: Diagnosis not present

## 2016-05-21 DIAGNOSIS — I10 Essential (primary) hypertension: Secondary | ICD-10-CM | POA: Diagnosis not present

## 2016-05-21 DIAGNOSIS — M6282 Rhabdomyolysis: Secondary | ICD-10-CM | POA: Diagnosis not present

## 2016-05-21 DIAGNOSIS — M545 Low back pain: Secondary | ICD-10-CM | POA: Diagnosis not present

## 2016-05-21 DIAGNOSIS — F329 Major depressive disorder, single episode, unspecified: Secondary | ICD-10-CM | POA: Diagnosis not present

## 2016-05-21 DIAGNOSIS — G894 Chronic pain syndrome: Secondary | ICD-10-CM | POA: Diagnosis not present

## 2016-05-21 DIAGNOSIS — J440 Chronic obstructive pulmonary disease with acute lower respiratory infection: Secondary | ICD-10-CM | POA: Diagnosis not present

## 2016-05-26 DIAGNOSIS — M542 Cervicalgia: Secondary | ICD-10-CM | POA: Diagnosis not present

## 2016-05-26 DIAGNOSIS — G894 Chronic pain syndrome: Secondary | ICD-10-CM | POA: Diagnosis not present

## 2016-05-26 DIAGNOSIS — M1711 Unilateral primary osteoarthritis, right knee: Secondary | ICD-10-CM | POA: Diagnosis not present

## 2016-05-26 DIAGNOSIS — J440 Chronic obstructive pulmonary disease with acute lower respiratory infection: Secondary | ICD-10-CM | POA: Diagnosis not present

## 2016-05-26 DIAGNOSIS — M6282 Rhabdomyolysis: Secondary | ICD-10-CM | POA: Diagnosis not present

## 2016-05-26 DIAGNOSIS — F329 Major depressive disorder, single episode, unspecified: Secondary | ICD-10-CM | POA: Diagnosis not present

## 2016-05-26 DIAGNOSIS — Z1389 Encounter for screening for other disorder: Secondary | ICD-10-CM | POA: Diagnosis not present

## 2016-05-26 DIAGNOSIS — I10 Essential (primary) hypertension: Secondary | ICD-10-CM | POA: Diagnosis not present

## 2016-05-26 DIAGNOSIS — M545 Low back pain: Secondary | ICD-10-CM | POA: Diagnosis not present

## 2016-05-26 DIAGNOSIS — G8929 Other chronic pain: Secondary | ICD-10-CM | POA: Diagnosis not present

## 2016-06-02 DIAGNOSIS — J440 Chronic obstructive pulmonary disease with acute lower respiratory infection: Secondary | ICD-10-CM | POA: Diagnosis not present

## 2016-06-02 DIAGNOSIS — G894 Chronic pain syndrome: Secondary | ICD-10-CM | POA: Diagnosis not present

## 2016-06-02 DIAGNOSIS — I10 Essential (primary) hypertension: Secondary | ICD-10-CM | POA: Diagnosis not present

## 2016-06-02 DIAGNOSIS — M545 Low back pain: Secondary | ICD-10-CM | POA: Diagnosis not present

## 2016-06-02 DIAGNOSIS — M6282 Rhabdomyolysis: Secondary | ICD-10-CM | POA: Diagnosis not present

## 2016-06-02 DIAGNOSIS — F329 Major depressive disorder, single episode, unspecified: Secondary | ICD-10-CM | POA: Diagnosis not present

## 2016-06-05 DIAGNOSIS — R5383 Other fatigue: Secondary | ICD-10-CM | POA: Diagnosis not present

## 2016-06-05 DIAGNOSIS — F411 Generalized anxiety disorder: Secondary | ICD-10-CM | POA: Diagnosis not present

## 2016-06-05 DIAGNOSIS — J301 Allergic rhinitis due to pollen: Secondary | ICD-10-CM | POA: Diagnosis not present

## 2016-06-05 DIAGNOSIS — J449 Chronic obstructive pulmonary disease, unspecified: Secondary | ICD-10-CM | POA: Diagnosis not present

## 2016-06-09 DIAGNOSIS — M545 Low back pain: Secondary | ICD-10-CM | POA: Diagnosis not present

## 2016-06-09 DIAGNOSIS — J440 Chronic obstructive pulmonary disease with acute lower respiratory infection: Secondary | ICD-10-CM | POA: Diagnosis not present

## 2016-06-09 DIAGNOSIS — F329 Major depressive disorder, single episode, unspecified: Secondary | ICD-10-CM | POA: Diagnosis not present

## 2016-06-09 DIAGNOSIS — I10 Essential (primary) hypertension: Secondary | ICD-10-CM | POA: Diagnosis not present

## 2016-06-09 DIAGNOSIS — G894 Chronic pain syndrome: Secondary | ICD-10-CM | POA: Diagnosis not present

## 2016-06-09 DIAGNOSIS — M6282 Rhabdomyolysis: Secondary | ICD-10-CM | POA: Diagnosis not present

## 2016-06-12 DIAGNOSIS — J301 Allergic rhinitis due to pollen: Secondary | ICD-10-CM | POA: Diagnosis not present

## 2016-06-12 DIAGNOSIS — R5383 Other fatigue: Secondary | ICD-10-CM | POA: Diagnosis not present

## 2016-06-12 DIAGNOSIS — J189 Pneumonia, unspecified organism: Secondary | ICD-10-CM | POA: Diagnosis not present

## 2016-06-12 DIAGNOSIS — J449 Chronic obstructive pulmonary disease, unspecified: Secondary | ICD-10-CM | POA: Diagnosis not present

## 2016-06-12 DIAGNOSIS — F411 Generalized anxiety disorder: Secondary | ICD-10-CM | POA: Diagnosis not present

## 2016-06-16 DIAGNOSIS — M6282 Rhabdomyolysis: Secondary | ICD-10-CM | POA: Diagnosis not present

## 2016-06-16 DIAGNOSIS — F329 Major depressive disorder, single episode, unspecified: Secondary | ICD-10-CM | POA: Diagnosis not present

## 2016-06-16 DIAGNOSIS — M545 Low back pain: Secondary | ICD-10-CM | POA: Diagnosis not present

## 2016-06-16 DIAGNOSIS — J440 Chronic obstructive pulmonary disease with acute lower respiratory infection: Secondary | ICD-10-CM | POA: Diagnosis not present

## 2016-06-16 DIAGNOSIS — G894 Chronic pain syndrome: Secondary | ICD-10-CM | POA: Diagnosis not present

## 2016-06-16 DIAGNOSIS — I10 Essential (primary) hypertension: Secondary | ICD-10-CM | POA: Diagnosis not present

## 2016-06-20 DIAGNOSIS — M545 Low back pain: Secondary | ICD-10-CM | POA: Diagnosis not present

## 2016-06-20 DIAGNOSIS — G894 Chronic pain syndrome: Secondary | ICD-10-CM | POA: Diagnosis not present

## 2016-06-20 DIAGNOSIS — E559 Vitamin D deficiency, unspecified: Secondary | ICD-10-CM | POA: Diagnosis not present

## 2016-06-20 DIAGNOSIS — M6282 Rhabdomyolysis: Secondary | ICD-10-CM | POA: Diagnosis not present

## 2016-06-20 DIAGNOSIS — J440 Chronic obstructive pulmonary disease with acute lower respiratory infection: Secondary | ICD-10-CM | POA: Diagnosis not present

## 2016-06-20 DIAGNOSIS — F329 Major depressive disorder, single episode, unspecified: Secondary | ICD-10-CM | POA: Diagnosis not present

## 2016-06-20 DIAGNOSIS — I10 Essential (primary) hypertension: Secondary | ICD-10-CM | POA: Diagnosis not present

## 2016-06-23 DIAGNOSIS — J440 Chronic obstructive pulmonary disease with acute lower respiratory infection: Secondary | ICD-10-CM | POA: Diagnosis not present

## 2016-06-23 DIAGNOSIS — R5383 Other fatigue: Secondary | ICD-10-CM | POA: Diagnosis not present

## 2016-06-23 DIAGNOSIS — J301 Allergic rhinitis due to pollen: Secondary | ICD-10-CM | POA: Diagnosis not present

## 2016-06-23 DIAGNOSIS — M545 Low back pain: Secondary | ICD-10-CM | POA: Diagnosis not present

## 2016-06-23 DIAGNOSIS — J189 Pneumonia, unspecified organism: Secondary | ICD-10-CM | POA: Diagnosis not present

## 2016-06-23 DIAGNOSIS — M6282 Rhabdomyolysis: Secondary | ICD-10-CM | POA: Diagnosis not present

## 2016-06-23 DIAGNOSIS — R911 Solitary pulmonary nodule: Secondary | ICD-10-CM | POA: Diagnosis not present

## 2016-06-23 DIAGNOSIS — I10 Essential (primary) hypertension: Secondary | ICD-10-CM | POA: Diagnosis not present

## 2016-06-23 DIAGNOSIS — G894 Chronic pain syndrome: Secondary | ICD-10-CM | POA: Diagnosis not present

## 2016-06-23 DIAGNOSIS — F329 Major depressive disorder, single episode, unspecified: Secondary | ICD-10-CM | POA: Diagnosis not present

## 2016-06-23 DIAGNOSIS — F411 Generalized anxiety disorder: Secondary | ICD-10-CM | POA: Diagnosis not present

## 2016-06-23 DIAGNOSIS — J449 Chronic obstructive pulmonary disease, unspecified: Secondary | ICD-10-CM | POA: Diagnosis not present

## 2016-06-25 DIAGNOSIS — G8929 Other chronic pain: Secondary | ICD-10-CM | POA: Diagnosis not present

## 2016-06-25 DIAGNOSIS — M545 Low back pain: Secondary | ICD-10-CM | POA: Diagnosis not present

## 2016-06-25 DIAGNOSIS — M1711 Unilateral primary osteoarthritis, right knee: Secondary | ICD-10-CM | POA: Diagnosis not present

## 2016-06-25 DIAGNOSIS — M542 Cervicalgia: Secondary | ICD-10-CM | POA: Diagnosis not present

## 2016-06-27 DIAGNOSIS — G894 Chronic pain syndrome: Secondary | ICD-10-CM | POA: Diagnosis not present

## 2016-06-27 DIAGNOSIS — M6282 Rhabdomyolysis: Secondary | ICD-10-CM | POA: Diagnosis not present

## 2016-06-27 DIAGNOSIS — F329 Major depressive disorder, single episode, unspecified: Secondary | ICD-10-CM | POA: Diagnosis not present

## 2016-06-27 DIAGNOSIS — I10 Essential (primary) hypertension: Secondary | ICD-10-CM | POA: Diagnosis not present

## 2016-06-27 DIAGNOSIS — M545 Low back pain: Secondary | ICD-10-CM | POA: Diagnosis not present

## 2016-06-27 DIAGNOSIS — J440 Chronic obstructive pulmonary disease with acute lower respiratory infection: Secondary | ICD-10-CM | POA: Diagnosis not present

## 2016-06-27 DIAGNOSIS — M1711 Unilateral primary osteoarthritis, right knee: Secondary | ICD-10-CM | POA: Diagnosis not present

## 2016-07-01 DIAGNOSIS — G894 Chronic pain syndrome: Secondary | ICD-10-CM | POA: Diagnosis not present

## 2016-07-01 DIAGNOSIS — M545 Low back pain: Secondary | ICD-10-CM | POA: Diagnosis not present

## 2016-07-01 DIAGNOSIS — J440 Chronic obstructive pulmonary disease with acute lower respiratory infection: Secondary | ICD-10-CM | POA: Diagnosis not present

## 2016-07-01 DIAGNOSIS — I10 Essential (primary) hypertension: Secondary | ICD-10-CM | POA: Diagnosis not present

## 2016-07-01 DIAGNOSIS — M6282 Rhabdomyolysis: Secondary | ICD-10-CM | POA: Diagnosis not present

## 2016-07-01 DIAGNOSIS — F329 Major depressive disorder, single episode, unspecified: Secondary | ICD-10-CM | POA: Diagnosis not present

## 2016-07-02 DIAGNOSIS — G894 Chronic pain syndrome: Secondary | ICD-10-CM | POA: Diagnosis not present

## 2016-07-02 DIAGNOSIS — M6282 Rhabdomyolysis: Secondary | ICD-10-CM | POA: Diagnosis not present

## 2016-07-02 DIAGNOSIS — F329 Major depressive disorder, single episode, unspecified: Secondary | ICD-10-CM | POA: Diagnosis not present

## 2016-07-02 DIAGNOSIS — I10 Essential (primary) hypertension: Secondary | ICD-10-CM | POA: Diagnosis not present

## 2016-07-02 DIAGNOSIS — M545 Low back pain: Secondary | ICD-10-CM | POA: Diagnosis not present

## 2016-07-02 DIAGNOSIS — J440 Chronic obstructive pulmonary disease with acute lower respiratory infection: Secondary | ICD-10-CM | POA: Diagnosis not present

## 2016-07-11 DIAGNOSIS — F329 Major depressive disorder, single episode, unspecified: Secondary | ICD-10-CM | POA: Diagnosis not present

## 2016-07-11 DIAGNOSIS — G894 Chronic pain syndrome: Secondary | ICD-10-CM | POA: Diagnosis not present

## 2016-07-11 DIAGNOSIS — M545 Low back pain: Secondary | ICD-10-CM | POA: Diagnosis not present

## 2016-07-11 DIAGNOSIS — M6282 Rhabdomyolysis: Secondary | ICD-10-CM | POA: Diagnosis not present

## 2016-07-11 DIAGNOSIS — J440 Chronic obstructive pulmonary disease with acute lower respiratory infection: Secondary | ICD-10-CM | POA: Diagnosis not present

## 2016-07-11 DIAGNOSIS — I10 Essential (primary) hypertension: Secondary | ICD-10-CM | POA: Diagnosis not present

## 2016-07-15 DIAGNOSIS — J189 Pneumonia, unspecified organism: Secondary | ICD-10-CM | POA: Diagnosis not present

## 2016-07-17 DIAGNOSIS — G894 Chronic pain syndrome: Secondary | ICD-10-CM | POA: Diagnosis not present

## 2016-07-17 DIAGNOSIS — I10 Essential (primary) hypertension: Secondary | ICD-10-CM | POA: Diagnosis not present

## 2016-07-17 DIAGNOSIS — F329 Major depressive disorder, single episode, unspecified: Secondary | ICD-10-CM | POA: Diagnosis not present

## 2016-07-17 DIAGNOSIS — M545 Low back pain: Secondary | ICD-10-CM | POA: Diagnosis not present

## 2016-07-17 DIAGNOSIS — M6282 Rhabdomyolysis: Secondary | ICD-10-CM | POA: Diagnosis not present

## 2016-07-17 DIAGNOSIS — J440 Chronic obstructive pulmonary disease with acute lower respiratory infection: Secondary | ICD-10-CM | POA: Diagnosis not present

## 2016-07-18 DIAGNOSIS — J449 Chronic obstructive pulmonary disease, unspecified: Secondary | ICD-10-CM | POA: Diagnosis not present

## 2016-07-18 DIAGNOSIS — J189 Pneumonia, unspecified organism: Secondary | ICD-10-CM | POA: Diagnosis not present

## 2016-07-18 DIAGNOSIS — J301 Allergic rhinitis due to pollen: Secondary | ICD-10-CM | POA: Diagnosis not present

## 2016-07-18 DIAGNOSIS — R5383 Other fatigue: Secondary | ICD-10-CM | POA: Diagnosis not present

## 2016-07-18 DIAGNOSIS — R911 Solitary pulmonary nodule: Secondary | ICD-10-CM | POA: Diagnosis not present

## 2016-07-18 DIAGNOSIS — F411 Generalized anxiety disorder: Secondary | ICD-10-CM | POA: Diagnosis not present

## 2016-07-22 DIAGNOSIS — R911 Solitary pulmonary nodule: Secondary | ICD-10-CM | POA: Diagnosis not present

## 2016-07-25 DIAGNOSIS — I10 Essential (primary) hypertension: Secondary | ICD-10-CM | POA: Diagnosis not present

## 2016-07-25 DIAGNOSIS — J440 Chronic obstructive pulmonary disease with acute lower respiratory infection: Secondary | ICD-10-CM | POA: Diagnosis not present

## 2016-07-25 DIAGNOSIS — G894 Chronic pain syndrome: Secondary | ICD-10-CM | POA: Diagnosis not present

## 2016-07-25 DIAGNOSIS — M6282 Rhabdomyolysis: Secondary | ICD-10-CM | POA: Diagnosis not present

## 2016-07-25 DIAGNOSIS — M545 Low back pain: Secondary | ICD-10-CM | POA: Diagnosis not present

## 2016-07-25 DIAGNOSIS — F329 Major depressive disorder, single episode, unspecified: Secondary | ICD-10-CM | POA: Diagnosis not present

## 2016-07-28 DIAGNOSIS — R918 Other nonspecific abnormal finding of lung field: Secondary | ICD-10-CM | POA: Diagnosis not present

## 2016-07-28 DIAGNOSIS — J449 Chronic obstructive pulmonary disease, unspecified: Secondary | ICD-10-CM | POA: Diagnosis not present

## 2016-07-29 DIAGNOSIS — F329 Major depressive disorder, single episode, unspecified: Secondary | ICD-10-CM | POA: Diagnosis not present

## 2016-07-29 DIAGNOSIS — J440 Chronic obstructive pulmonary disease with acute lower respiratory infection: Secondary | ICD-10-CM | POA: Diagnosis not present

## 2016-07-29 DIAGNOSIS — M6282 Rhabdomyolysis: Secondary | ICD-10-CM | POA: Diagnosis not present

## 2016-07-29 DIAGNOSIS — G894 Chronic pain syndrome: Secondary | ICD-10-CM | POA: Diagnosis not present

## 2016-07-29 DIAGNOSIS — I10 Essential (primary) hypertension: Secondary | ICD-10-CM | POA: Diagnosis not present

## 2016-07-29 DIAGNOSIS — M545 Low back pain: Secondary | ICD-10-CM | POA: Diagnosis not present

## 2016-08-01 DIAGNOSIS — R848 Other abnormal findings in specimens from respiratory organs and thorax: Secondary | ICD-10-CM | POA: Diagnosis not present

## 2016-08-01 DIAGNOSIS — Z87891 Personal history of nicotine dependence: Secondary | ICD-10-CM | POA: Diagnosis not present

## 2016-08-01 DIAGNOSIS — R911 Solitary pulmonary nodule: Secondary | ICD-10-CM | POA: Diagnosis not present

## 2016-08-01 DIAGNOSIS — J8489 Other specified interstitial pulmonary diseases: Secondary | ICD-10-CM | POA: Diagnosis not present

## 2016-08-01 DIAGNOSIS — R918 Other nonspecific abnormal finding of lung field: Secondary | ICD-10-CM | POA: Diagnosis not present

## 2016-08-01 DIAGNOSIS — J9811 Atelectasis: Secondary | ICD-10-CM | POA: Diagnosis not present

## 2016-08-01 DIAGNOSIS — J449 Chronic obstructive pulmonary disease, unspecified: Secondary | ICD-10-CM | POA: Diagnosis not present

## 2016-08-08 DIAGNOSIS — J449 Chronic obstructive pulmonary disease, unspecified: Secondary | ICD-10-CM | POA: Diagnosis not present

## 2016-08-08 DIAGNOSIS — J15212 Pneumonia due to Methicillin resistant Staphylococcus aureus: Secondary | ICD-10-CM | POA: Diagnosis not present

## 2016-08-08 DIAGNOSIS — J189 Pneumonia, unspecified organism: Secondary | ICD-10-CM | POA: Diagnosis not present

## 2016-08-08 DIAGNOSIS — Z452 Encounter for adjustment and management of vascular access device: Secondary | ICD-10-CM | POA: Diagnosis not present

## 2016-08-09 DIAGNOSIS — M545 Low back pain: Secondary | ICD-10-CM | POA: Diagnosis not present

## 2016-08-09 DIAGNOSIS — J15212 Pneumonia due to Methicillin resistant Staphylococcus aureus: Secondary | ICD-10-CM | POA: Diagnosis not present

## 2016-08-09 DIAGNOSIS — G894 Chronic pain syndrome: Secondary | ICD-10-CM | POA: Diagnosis not present

## 2016-08-09 DIAGNOSIS — F329 Major depressive disorder, single episode, unspecified: Secondary | ICD-10-CM | POA: Diagnosis not present

## 2016-08-09 DIAGNOSIS — J440 Chronic obstructive pulmonary disease with acute lower respiratory infection: Secondary | ICD-10-CM | POA: Diagnosis not present

## 2016-08-09 DIAGNOSIS — M6282 Rhabdomyolysis: Secondary | ICD-10-CM | POA: Diagnosis not present

## 2016-08-09 DIAGNOSIS — I10 Essential (primary) hypertension: Secondary | ICD-10-CM | POA: Diagnosis not present

## 2016-08-12 DIAGNOSIS — J189 Pneumonia, unspecified organism: Secondary | ICD-10-CM | POA: Diagnosis not present

## 2016-08-12 DIAGNOSIS — B9562 Methicillin resistant Staphylococcus aureus infection as the cause of diseases classified elsewhere: Secondary | ICD-10-CM | POA: Diagnosis not present

## 2016-08-12 DIAGNOSIS — M545 Low back pain: Secondary | ICD-10-CM | POA: Diagnosis not present

## 2016-08-12 DIAGNOSIS — M6282 Rhabdomyolysis: Secondary | ICD-10-CM | POA: Diagnosis not present

## 2016-08-12 DIAGNOSIS — F329 Major depressive disorder, single episode, unspecified: Secondary | ICD-10-CM | POA: Diagnosis not present

## 2016-08-12 DIAGNOSIS — G894 Chronic pain syndrome: Secondary | ICD-10-CM | POA: Diagnosis not present

## 2016-08-12 DIAGNOSIS — I10 Essential (primary) hypertension: Secondary | ICD-10-CM | POA: Diagnosis not present

## 2016-08-12 DIAGNOSIS — J440 Chronic obstructive pulmonary disease with acute lower respiratory infection: Secondary | ICD-10-CM | POA: Diagnosis not present

## 2016-08-13 DIAGNOSIS — F329 Major depressive disorder, single episode, unspecified: Secondary | ICD-10-CM | POA: Diagnosis not present

## 2016-08-13 DIAGNOSIS — M6282 Rhabdomyolysis: Secondary | ICD-10-CM | POA: Diagnosis not present

## 2016-08-13 DIAGNOSIS — M545 Low back pain: Secondary | ICD-10-CM | POA: Diagnosis not present

## 2016-08-13 DIAGNOSIS — J440 Chronic obstructive pulmonary disease with acute lower respiratory infection: Secondary | ICD-10-CM | POA: Diagnosis not present

## 2016-08-13 DIAGNOSIS — J189 Pneumonia, unspecified organism: Secondary | ICD-10-CM | POA: Diagnosis not present

## 2016-08-13 DIAGNOSIS — G894 Chronic pain syndrome: Secondary | ICD-10-CM | POA: Diagnosis not present

## 2016-08-13 DIAGNOSIS — I10 Essential (primary) hypertension: Secondary | ICD-10-CM | POA: Diagnosis not present

## 2016-08-13 DIAGNOSIS — B9562 Methicillin resistant Staphylococcus aureus infection as the cause of diseases classified elsewhere: Secondary | ICD-10-CM | POA: Diagnosis not present

## 2016-08-15 DIAGNOSIS — J189 Pneumonia, unspecified organism: Secondary | ICD-10-CM | POA: Diagnosis not present

## 2016-08-15 DIAGNOSIS — M6282 Rhabdomyolysis: Secondary | ICD-10-CM | POA: Diagnosis not present

## 2016-08-15 DIAGNOSIS — M545 Low back pain: Secondary | ICD-10-CM | POA: Diagnosis not present

## 2016-08-15 DIAGNOSIS — I10 Essential (primary) hypertension: Secondary | ICD-10-CM | POA: Diagnosis not present

## 2016-08-15 DIAGNOSIS — F329 Major depressive disorder, single episode, unspecified: Secondary | ICD-10-CM | POA: Diagnosis not present

## 2016-08-15 DIAGNOSIS — G894 Chronic pain syndrome: Secondary | ICD-10-CM | POA: Diagnosis not present

## 2016-08-15 DIAGNOSIS — J440 Chronic obstructive pulmonary disease with acute lower respiratory infection: Secondary | ICD-10-CM | POA: Diagnosis not present

## 2016-08-15 DIAGNOSIS — B9562 Methicillin resistant Staphylococcus aureus infection as the cause of diseases classified elsewhere: Secondary | ICD-10-CM | POA: Diagnosis not present

## 2016-08-18 DIAGNOSIS — M6282 Rhabdomyolysis: Secondary | ICD-10-CM | POA: Diagnosis not present

## 2016-08-18 DIAGNOSIS — M545 Low back pain: Secondary | ICD-10-CM | POA: Diagnosis not present

## 2016-08-18 DIAGNOSIS — B9562 Methicillin resistant Staphylococcus aureus infection as the cause of diseases classified elsewhere: Secondary | ICD-10-CM | POA: Diagnosis not present

## 2016-08-18 DIAGNOSIS — G894 Chronic pain syndrome: Secondary | ICD-10-CM | POA: Diagnosis not present

## 2016-08-18 DIAGNOSIS — Z8701 Personal history of pneumonia (recurrent): Secondary | ICD-10-CM | POA: Diagnosis not present

## 2016-08-18 DIAGNOSIS — F329 Major depressive disorder, single episode, unspecified: Secondary | ICD-10-CM | POA: Diagnosis not present

## 2016-08-18 DIAGNOSIS — J440 Chronic obstructive pulmonary disease with acute lower respiratory infection: Secondary | ICD-10-CM | POA: Diagnosis not present

## 2016-08-18 DIAGNOSIS — J189 Pneumonia, unspecified organism: Secondary | ICD-10-CM | POA: Diagnosis not present

## 2016-08-18 DIAGNOSIS — I10 Essential (primary) hypertension: Secondary | ICD-10-CM | POA: Diagnosis not present

## 2016-08-22 DIAGNOSIS — F329 Major depressive disorder, single episode, unspecified: Secondary | ICD-10-CM | POA: Diagnosis not present

## 2016-08-22 DIAGNOSIS — G894 Chronic pain syndrome: Secondary | ICD-10-CM | POA: Diagnosis not present

## 2016-08-22 DIAGNOSIS — I10 Essential (primary) hypertension: Secondary | ICD-10-CM | POA: Diagnosis not present

## 2016-08-22 DIAGNOSIS — J440 Chronic obstructive pulmonary disease with acute lower respiratory infection: Secondary | ICD-10-CM | POA: Diagnosis not present

## 2016-08-22 DIAGNOSIS — M545 Low back pain: Secondary | ICD-10-CM | POA: Diagnosis not present

## 2016-08-22 DIAGNOSIS — M6282 Rhabdomyolysis: Secondary | ICD-10-CM | POA: Diagnosis not present

## 2016-08-22 DIAGNOSIS — J189 Pneumonia, unspecified organism: Secondary | ICD-10-CM | POA: Diagnosis not present

## 2016-08-27 DIAGNOSIS — M542 Cervicalgia: Secondary | ICD-10-CM | POA: Diagnosis not present

## 2016-08-27 DIAGNOSIS — G8929 Other chronic pain: Secondary | ICD-10-CM | POA: Diagnosis not present

## 2016-08-29 DIAGNOSIS — J440 Chronic obstructive pulmonary disease with acute lower respiratory infection: Secondary | ICD-10-CM | POA: Diagnosis not present

## 2016-08-29 DIAGNOSIS — F411 Generalized anxiety disorder: Secondary | ICD-10-CM | POA: Diagnosis not present

## 2016-08-29 DIAGNOSIS — F329 Major depressive disorder, single episode, unspecified: Secondary | ICD-10-CM | POA: Diagnosis not present

## 2016-08-29 DIAGNOSIS — R911 Solitary pulmonary nodule: Secondary | ICD-10-CM | POA: Diagnosis not present

## 2016-08-29 DIAGNOSIS — G894 Chronic pain syndrome: Secondary | ICD-10-CM | POA: Diagnosis not present

## 2016-08-29 DIAGNOSIS — M545 Low back pain: Secondary | ICD-10-CM | POA: Diagnosis not present

## 2016-08-29 DIAGNOSIS — I10 Essential (primary) hypertension: Secondary | ICD-10-CM | POA: Diagnosis not present

## 2016-08-29 DIAGNOSIS — R5383 Other fatigue: Secondary | ICD-10-CM | POA: Diagnosis not present

## 2016-08-29 DIAGNOSIS — J449 Chronic obstructive pulmonary disease, unspecified: Secondary | ICD-10-CM | POA: Diagnosis not present

## 2016-08-29 DIAGNOSIS — M6282 Rhabdomyolysis: Secondary | ICD-10-CM | POA: Diagnosis not present

## 2016-09-17 DIAGNOSIS — I700 Atherosclerosis of aorta: Secondary | ICD-10-CM | POA: Diagnosis not present

## 2016-09-17 DIAGNOSIS — J439 Emphysema, unspecified: Secondary | ICD-10-CM | POA: Diagnosis not present

## 2016-09-17 DIAGNOSIS — R911 Solitary pulmonary nodule: Secondary | ICD-10-CM | POA: Diagnosis not present

## 2016-09-17 DIAGNOSIS — I2510 Atherosclerotic heart disease of native coronary artery without angina pectoris: Secondary | ICD-10-CM | POA: Diagnosis not present

## 2016-09-17 DIAGNOSIS — R918 Other nonspecific abnormal finding of lung field: Secondary | ICD-10-CM | POA: Diagnosis not present

## 2016-09-19 DIAGNOSIS — R911 Solitary pulmonary nodule: Secondary | ICD-10-CM | POA: Diagnosis not present

## 2016-09-19 DIAGNOSIS — F411 Generalized anxiety disorder: Secondary | ICD-10-CM | POA: Diagnosis not present

## 2016-09-19 DIAGNOSIS — J449 Chronic obstructive pulmonary disease, unspecified: Secondary | ICD-10-CM | POA: Diagnosis not present

## 2016-09-19 DIAGNOSIS — J301 Allergic rhinitis due to pollen: Secondary | ICD-10-CM | POA: Diagnosis not present

## 2016-09-19 DIAGNOSIS — R5383 Other fatigue: Secondary | ICD-10-CM | POA: Diagnosis not present

## 2016-09-25 DIAGNOSIS — G8929 Other chronic pain: Secondary | ICD-10-CM | POA: Diagnosis not present

## 2016-09-25 DIAGNOSIS — M542 Cervicalgia: Secondary | ICD-10-CM | POA: Diagnosis not present

## 2016-10-15 DIAGNOSIS — J209 Acute bronchitis, unspecified: Secondary | ICD-10-CM | POA: Diagnosis not present

## 2016-11-24 DIAGNOSIS — J40 Bronchitis, not specified as acute or chronic: Secondary | ICD-10-CM | POA: Diagnosis not present

## 2016-11-24 DIAGNOSIS — Z9109 Other allergy status, other than to drugs and biological substances: Secondary | ICD-10-CM | POA: Diagnosis not present

## 2016-11-24 DIAGNOSIS — I10 Essential (primary) hypertension: Secondary | ICD-10-CM | POA: Diagnosis not present

## 2016-11-24 DIAGNOSIS — E039 Hypothyroidism, unspecified: Secondary | ICD-10-CM | POA: Diagnosis not present

## 2016-11-24 DIAGNOSIS — G43909 Migraine, unspecified, not intractable, without status migrainosus: Secondary | ICD-10-CM | POA: Diagnosis not present

## 2016-11-24 DIAGNOSIS — Z1389 Encounter for screening for other disorder: Secondary | ICD-10-CM | POA: Diagnosis not present

## 2016-11-24 DIAGNOSIS — E663 Overweight: Secondary | ICD-10-CM | POA: Diagnosis not present

## 2016-11-24 DIAGNOSIS — Z6829 Body mass index (BMI) 29.0-29.9, adult: Secondary | ICD-10-CM | POA: Diagnosis not present

## 2016-11-26 DIAGNOSIS — F411 Generalized anxiety disorder: Secondary | ICD-10-CM | POA: Diagnosis not present

## 2016-11-26 DIAGNOSIS — R5383 Other fatigue: Secondary | ICD-10-CM | POA: Diagnosis not present

## 2016-11-26 DIAGNOSIS — R911 Solitary pulmonary nodule: Secondary | ICD-10-CM | POA: Diagnosis not present

## 2016-11-26 DIAGNOSIS — J449 Chronic obstructive pulmonary disease, unspecified: Secondary | ICD-10-CM | POA: Diagnosis not present

## 2016-12-18 DIAGNOSIS — J441 Chronic obstructive pulmonary disease with (acute) exacerbation: Secondary | ICD-10-CM | POA: Diagnosis not present

## 2016-12-18 DIAGNOSIS — R5383 Other fatigue: Secondary | ICD-10-CM | POA: Diagnosis not present

## 2016-12-18 DIAGNOSIS — J301 Allergic rhinitis due to pollen: Secondary | ICD-10-CM | POA: Diagnosis not present

## 2016-12-19 DIAGNOSIS — C44719 Basal cell carcinoma of skin of left lower limb, including hip: Secondary | ICD-10-CM | POA: Diagnosis not present

## 2016-12-19 DIAGNOSIS — L578 Other skin changes due to chronic exposure to nonionizing radiation: Secondary | ICD-10-CM | POA: Diagnosis not present

## 2017-01-12 DIAGNOSIS — F419 Anxiety disorder, unspecified: Secondary | ICD-10-CM | POA: Diagnosis not present

## 2017-01-12 DIAGNOSIS — R070 Pain in throat: Secondary | ICD-10-CM | POA: Diagnosis not present

## 2017-01-12 DIAGNOSIS — R509 Fever, unspecified: Secondary | ICD-10-CM | POA: Diagnosis not present

## 2017-01-12 DIAGNOSIS — J029 Acute pharyngitis, unspecified: Secondary | ICD-10-CM | POA: Diagnosis not present

## 2017-01-12 DIAGNOSIS — B379 Candidiasis, unspecified: Secondary | ICD-10-CM | POA: Diagnosis not present

## 2017-01-12 DIAGNOSIS — R110 Nausea: Secondary | ICD-10-CM | POA: Diagnosis not present

## 2017-01-12 DIAGNOSIS — Z79899 Other long term (current) drug therapy: Secondary | ICD-10-CM | POA: Diagnosis not present

## 2017-01-22 DIAGNOSIS — M47812 Spondylosis without myelopathy or radiculopathy, cervical region: Secondary | ICD-10-CM | POA: Diagnosis not present

## 2017-01-22 DIAGNOSIS — M542 Cervicalgia: Secondary | ICD-10-CM | POA: Diagnosis not present

## 2017-01-22 DIAGNOSIS — G8929 Other chronic pain: Secondary | ICD-10-CM | POA: Diagnosis not present

## 2017-01-22 DIAGNOSIS — Z9119 Patient's noncompliance with other medical treatment and regimen: Secondary | ICD-10-CM | POA: Diagnosis not present

## 2017-02-03 DIAGNOSIS — R509 Fever, unspecified: Secondary | ICD-10-CM | POA: Diagnosis not present

## 2017-02-03 DIAGNOSIS — Z13220 Encounter for screening for lipoid disorders: Secondary | ICD-10-CM | POA: Diagnosis not present

## 2017-02-03 DIAGNOSIS — Z79899 Other long term (current) drug therapy: Secondary | ICD-10-CM | POA: Diagnosis not present

## 2017-02-03 DIAGNOSIS — F419 Anxiety disorder, unspecified: Secondary | ICD-10-CM | POA: Diagnosis not present

## 2017-02-13 DIAGNOSIS — R05 Cough: Secondary | ICD-10-CM | POA: Diagnosis not present

## 2017-02-23 DIAGNOSIS — R0602 Shortness of breath: Secondary | ICD-10-CM | POA: Diagnosis not present

## 2017-02-23 DIAGNOSIS — Z1389 Encounter for screening for other disorder: Secondary | ICD-10-CM | POA: Diagnosis not present

## 2017-02-23 DIAGNOSIS — R112 Nausea with vomiting, unspecified: Secondary | ICD-10-CM | POA: Diagnosis not present

## 2017-02-23 DIAGNOSIS — Z1379 Encounter for other screening for genetic and chromosomal anomalies: Secondary | ICD-10-CM | POA: Diagnosis not present

## 2017-02-23 DIAGNOSIS — R05 Cough: Secondary | ICD-10-CM | POA: Diagnosis not present

## 2017-02-23 DIAGNOSIS — F3160 Bipolar disorder, current episode mixed, unspecified: Secondary | ICD-10-CM | POA: Diagnosis not present

## 2017-02-23 DIAGNOSIS — M542 Cervicalgia: Secondary | ICD-10-CM | POA: Diagnosis not present

## 2017-02-23 DIAGNOSIS — F339 Major depressive disorder, recurrent, unspecified: Secondary | ICD-10-CM | POA: Diagnosis not present

## 2017-02-23 DIAGNOSIS — E669 Obesity, unspecified: Secondary | ICD-10-CM | POA: Diagnosis not present

## 2017-02-23 DIAGNOSIS — G43909 Migraine, unspecified, not intractable, without status migrainosus: Secondary | ICD-10-CM | POA: Diagnosis not present

## 2017-02-23 DIAGNOSIS — Z136 Encounter for screening for cardiovascular disorders: Secondary | ICD-10-CM | POA: Diagnosis not present

## 2017-02-23 DIAGNOSIS — J019 Acute sinusitis, unspecified: Secondary | ICD-10-CM | POA: Diagnosis not present

## 2017-02-23 DIAGNOSIS — G8929 Other chronic pain: Secondary | ICD-10-CM | POA: Diagnosis not present

## 2017-02-23 DIAGNOSIS — M47812 Spondylosis without myelopathy or radiculopathy, cervical region: Secondary | ICD-10-CM | POA: Diagnosis not present

## 2017-02-23 DIAGNOSIS — F419 Anxiety disorder, unspecified: Secondary | ICD-10-CM | POA: Diagnosis not present

## 2017-03-13 DIAGNOSIS — D509 Iron deficiency anemia, unspecified: Secondary | ICD-10-CM | POA: Diagnosis not present

## 2017-03-13 DIAGNOSIS — D519 Vitamin B12 deficiency anemia, unspecified: Secondary | ICD-10-CM | POA: Diagnosis not present

## 2017-03-13 DIAGNOSIS — Z79899 Other long term (current) drug therapy: Secondary | ICD-10-CM | POA: Diagnosis not present

## 2017-03-17 DIAGNOSIS — E039 Hypothyroidism, unspecified: Secondary | ICD-10-CM | POA: Diagnosis not present

## 2017-03-17 DIAGNOSIS — D519 Vitamin B12 deficiency anemia, unspecified: Secondary | ICD-10-CM | POA: Diagnosis not present

## 2017-03-23 DIAGNOSIS — G8929 Other chronic pain: Secondary | ICD-10-CM | POA: Diagnosis not present

## 2017-03-23 DIAGNOSIS — R918 Other nonspecific abnormal finding of lung field: Secondary | ICD-10-CM | POA: Diagnosis not present

## 2017-03-23 DIAGNOSIS — M542 Cervicalgia: Secondary | ICD-10-CM | POA: Diagnosis not present

## 2017-03-23 DIAGNOSIS — J439 Emphysema, unspecified: Secondary | ICD-10-CM | POA: Diagnosis not present

## 2017-03-23 DIAGNOSIS — M47812 Spondylosis without myelopathy or radiculopathy, cervical region: Secondary | ICD-10-CM | POA: Diagnosis not present

## 2017-03-23 DIAGNOSIS — I700 Atherosclerosis of aorta: Secondary | ICD-10-CM | POA: Diagnosis not present

## 2017-03-23 DIAGNOSIS — R911 Solitary pulmonary nodule: Secondary | ICD-10-CM | POA: Diagnosis not present

## 2017-03-24 DIAGNOSIS — R911 Solitary pulmonary nodule: Secondary | ICD-10-CM | POA: Diagnosis not present

## 2017-03-24 DIAGNOSIS — J301 Allergic rhinitis due to pollen: Secondary | ICD-10-CM | POA: Diagnosis not present

## 2017-03-24 DIAGNOSIS — J441 Chronic obstructive pulmonary disease with (acute) exacerbation: Secondary | ICD-10-CM | POA: Diagnosis not present

## 2017-03-24 DIAGNOSIS — F411 Generalized anxiety disorder: Secondary | ICD-10-CM | POA: Diagnosis not present

## 2017-03-27 DIAGNOSIS — F411 Generalized anxiety disorder: Secondary | ICD-10-CM | POA: Diagnosis not present

## 2017-03-27 DIAGNOSIS — J441 Chronic obstructive pulmonary disease with (acute) exacerbation: Secondary | ICD-10-CM | POA: Diagnosis not present

## 2017-03-27 DIAGNOSIS — J301 Allergic rhinitis due to pollen: Secondary | ICD-10-CM | POA: Diagnosis not present

## 2017-03-27 DIAGNOSIS — R5383 Other fatigue: Secondary | ICD-10-CM | POA: Diagnosis not present

## 2017-04-15 DIAGNOSIS — D519 Vitamin B12 deficiency anemia, unspecified: Secondary | ICD-10-CM | POA: Diagnosis not present

## 2017-04-15 DIAGNOSIS — M1711 Unilateral primary osteoarthritis, right knee: Secondary | ICD-10-CM | POA: Diagnosis not present

## 2017-04-15 DIAGNOSIS — E039 Hypothyroidism, unspecified: Secondary | ICD-10-CM | POA: Diagnosis not present

## 2017-04-15 DIAGNOSIS — Z79899 Other long term (current) drug therapy: Secondary | ICD-10-CM | POA: Diagnosis not present

## 2017-04-21 DIAGNOSIS — M542 Cervicalgia: Secondary | ICD-10-CM | POA: Diagnosis not present

## 2017-04-21 DIAGNOSIS — M47812 Spondylosis without myelopathy or radiculopathy, cervical region: Secondary | ICD-10-CM | POA: Diagnosis not present

## 2017-04-21 DIAGNOSIS — G8929 Other chronic pain: Secondary | ICD-10-CM | POA: Diagnosis not present

## 2017-04-22 DIAGNOSIS — M25532 Pain in left wrist: Secondary | ICD-10-CM | POA: Diagnosis not present

## 2017-04-22 DIAGNOSIS — R2232 Localized swelling, mass and lump, left upper limb: Secondary | ICD-10-CM | POA: Diagnosis not present

## 2017-04-22 DIAGNOSIS — M25432 Effusion, left wrist: Secondary | ICD-10-CM | POA: Diagnosis not present

## 2017-04-23 DIAGNOSIS — R2232 Localized swelling, mass and lump, left upper limb: Secondary | ICD-10-CM | POA: Diagnosis not present

## 2017-05-11 DIAGNOSIS — D509 Iron deficiency anemia, unspecified: Secondary | ICD-10-CM | POA: Diagnosis not present

## 2017-05-11 DIAGNOSIS — E039 Hypothyroidism, unspecified: Secondary | ICD-10-CM | POA: Diagnosis not present

## 2017-05-19 DIAGNOSIS — G8929 Other chronic pain: Secondary | ICD-10-CM | POA: Diagnosis not present

## 2017-05-19 DIAGNOSIS — M542 Cervicalgia: Secondary | ICD-10-CM | POA: Diagnosis not present

## 2017-05-19 DIAGNOSIS — M47812 Spondylosis without myelopathy or radiculopathy, cervical region: Secondary | ICD-10-CM | POA: Diagnosis not present

## 2017-05-19 DIAGNOSIS — Z1331 Encounter for screening for depression: Secondary | ICD-10-CM | POA: Diagnosis not present

## 2017-06-19 DIAGNOSIS — J441 Chronic obstructive pulmonary disease with (acute) exacerbation: Secondary | ICD-10-CM | POA: Diagnosis not present

## 2017-06-19 DIAGNOSIS — J309 Allergic rhinitis, unspecified: Secondary | ICD-10-CM | POA: Diagnosis not present

## 2017-06-19 DIAGNOSIS — R911 Solitary pulmonary nodule: Secondary | ICD-10-CM | POA: Diagnosis not present

## 2017-07-03 DIAGNOSIS — J449 Chronic obstructive pulmonary disease, unspecified: Secondary | ICD-10-CM | POA: Diagnosis not present

## 2017-07-03 DIAGNOSIS — D649 Anemia, unspecified: Secondary | ICD-10-CM | POA: Diagnosis not present

## 2017-07-03 DIAGNOSIS — R05 Cough: Secondary | ICD-10-CM | POA: Diagnosis not present

## 2017-07-03 DIAGNOSIS — I10 Essential (primary) hypertension: Secondary | ICD-10-CM | POA: Diagnosis not present

## 2017-07-03 DIAGNOSIS — Z79899 Other long term (current) drug therapy: Secondary | ICD-10-CM | POA: Diagnosis not present

## 2017-07-03 DIAGNOSIS — E039 Hypothyroidism, unspecified: Secondary | ICD-10-CM | POA: Diagnosis not present

## 2017-07-03 DIAGNOSIS — D509 Iron deficiency anemia, unspecified: Secondary | ICD-10-CM | POA: Diagnosis not present

## 2017-07-03 DIAGNOSIS — E785 Hyperlipidemia, unspecified: Secondary | ICD-10-CM | POA: Diagnosis not present

## 2018-01-06 DIAGNOSIS — J9620 Acute and chronic respiratory failure, unspecified whether with hypoxia or hypercapnia: Secondary | ICD-10-CM | POA: Diagnosis not present

## 2018-01-06 DIAGNOSIS — J189 Pneumonia, unspecified organism: Secondary | ICD-10-CM

## 2018-01-07 DIAGNOSIS — J189 Pneumonia, unspecified organism: Secondary | ICD-10-CM | POA: Diagnosis not present

## 2018-01-07 DIAGNOSIS — J9620 Acute and chronic respiratory failure, unspecified whether with hypoxia or hypercapnia: Secondary | ICD-10-CM | POA: Diagnosis not present

## 2018-01-07 DIAGNOSIS — R0602 Shortness of breath: Secondary | ICD-10-CM | POA: Diagnosis not present

## 2020-07-19 DIAGNOSIS — R0600 Dyspnea, unspecified: Secondary | ICD-10-CM | POA: Diagnosis not present

## 2020-08-07 DIAGNOSIS — G459 Transient cerebral ischemic attack, unspecified: Secondary | ICD-10-CM | POA: Insufficient documentation

## 2020-08-07 DIAGNOSIS — R5383 Other fatigue: Secondary | ICD-10-CM | POA: Insufficient documentation

## 2020-08-07 DIAGNOSIS — R0600 Dyspnea, unspecified: Secondary | ICD-10-CM | POA: Insufficient documentation

## 2020-08-07 DIAGNOSIS — J45909 Unspecified asthma, uncomplicated: Secondary | ICD-10-CM | POA: Insufficient documentation

## 2020-08-09 ENCOUNTER — Encounter: Payer: Self-pay | Admitting: *Deleted

## 2020-08-09 ENCOUNTER — Encounter: Payer: Self-pay | Admitting: Cardiology

## 2020-08-19 NOTE — Progress Notes (Unsigned)
Cardiology Office Note:    Date:  08/20/2020   ID:  Charlotte Wallace, DOB 1960-11-01, MRN [id number]  PCP:  Charlotte Riches, NP  Cardiologist:  Charlotte More, MD   Referring MD: Charlotte Rhyme, MD  ASSESSMENT:    1. Shortness of breath   2. Chronic obstructive pulmonary disease, unspecified COPD type ([place])   3. Hypertension, unspecified type   4. Palpitations   5. Coronary artery calcification seen on CAT scan   6. Hyperlipidemia, unspecified hyperlipidemia type    PLAN:    In order of problems listed above:  1. Clinically and I discussed this with her I think that her symptoms are due to progressive COPD.  Her echocardiogram was unremarkable and I do not think she requires a hemodynamic cath or has heart failure.  We will check a proBNP level unsuspected to be reassuring. 2. Severe COPD oxygen dependent continue current treatment managed by pulmonary 3. Stable BP at target continue treatment beta-blocker ARB 4. Check 7-day ZIO monitor 5. She has had a previous ischemia evaluation that was normal she is on appropriate therapy including clopidogrel beta-blocker and lipid-lowering with a high intensity statin and will discuss next visit whether she should have further evaluation like cardiac CTA.  She does not want to repeat a myocardial perfusion test 6. Hyperlipidemia stable continue her statin   Next appointment 4 weeks   Medication Adjustments/Labs and Tests Ordered: Current medicines are reviewed at length with the patient today.  Concerns regarding medicines are outlined above.  Orders Placed This Encounter  Procedures  . Pro b natriuretic peptide (BNP)  . LONG TERM MONITOR (3-14 DAYS)  . EKG 12-Lead   No orders of the defined types were placed in this encounter.    No chief complaint on file.   History of Present Illness:    Charlotte Wallace is a 60 y.o. female with a history of coronary artery calcification, hypertension and COPD who is being seen today for the  evaluation of shortness of breath at the request of Charlotte Rhyme, MD.  She had an echocardiogram performed at [facility] 07/19/2020 showing mild concentric LVH low normal ejection fraction 50 to 55% mild left atrial enlargement and indeterminant diastolic filling pressures.  The right ventricle was normal and the pulmonary arterial pressure could not be estimated.  Chart review: She had chest CT without contrast performed 02/02/2018 showing left lower lobe consolidation felt to be due to pneumonia also seen in the right base she had coronary artery calcification and aortic atherosclerosis noted at that time.  Her EKG from that time showed sinus rhythm with nonspecific ST and T changes. Review shows hospitalization of 2009 when she was felt to have had TIA.  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  And ejection fraction that time was felt to be normal. A transesophageal echocardiogram was performed March 2010 showing a small amount of shunt with what was described as a tiny patent foramen ovale.  There is no late left atrial thrombus noted. Carotid duplex [facility] 2017 showed 1 to 39% stenosis of the ICA bilaterally. Repeat transthoracic echocardiogram August 2017 was read as normal. Chest x-ray August 2017 findings of COPD  Recently she has worsened and since Christmas has increasing shortness of breath she has to stop and  rest with ADLs at times short of breath in the home and struggles with activities like walking the hallway in my office.  She notices a little bit of peripheral edema in the evenings of her feet she sleeps with the head of the bed up at about 15 to 20 degrees but does not have orthopnea or PND she is not coughing or wheezing and she is not had chest  pain.  She said there is been concerns about her pulse being irregular and she does get palpitation not severe sustained.  At home her heart rates run in the range of 70 bpm and oxygen sats run 90 to 95% and uses oxygen constantly.  She has no known history of congenital rheumatic heart disease or CAD.  We talked about an ischemia evaluation but she told me in the past 3 to 5 years she had one done at [facility] and I said we would access that report before recommendation.  We accessed the nuclear medicine dobutamine Cardiolite done at [facility] November 2016 which was normal with an EF of 57% Past Medical History:  Diagnosis Date  . Anxiety 02/27/2016  . Asthma   . Chronic anticoagulation 01/10/2016  . Chronic back pain 02/27/2016  . COPD (chronic obstructive pulmonary disease) ([place]) 02/27/2016  . Depression 02/27/2016  . Diarrhea 02/27/2016  . Dyspnea   . Elevated liver function tests 02/28/2016  . Facet arthritis of lumbar region 01/10/2016  . Fatigue   . Generalized pain 02/27/2016  . Hypertension 02/27/2016  . Hypothyroidism 02/27/2016  . Leukocytosis 02/28/2016  . 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 Lumbar degenerative disc disease 01/10/2016  . Mixed simple and mucopurulent chronic bronchitis ([place]) 01/10/2016  . Muscular deconditioning 02/27/2016  . Non-traumatic rhabdomyolysis 02/27/2016  . Osteoporosis 09/07/2015  . Pneumonia 02/29/2016  . Syncope 02/27/2016  . TIA (transient ischemic attack)   . Vitamin D deficiency 09/07/2015    Past Surgical History:  Procedure Laterality Date  . ABDOMINAL HYSTERECTOMY    . BACK SURGERY    . HERNIA REPAIR    . HYSTEROSCOPY    . SPLENECTOMY, TOTAL    . TOTAL KNEE ARTHROPLASTY      Current Medications: Current Meds  Medication Sig  . albuterol (VENTOLIN HFA) 108 (90 Base) MCG/ACT inhaler Inhale 2 puffs into the lungs every 6 (six) hours as needed for wheezing or shortness of breath.  . Ascorbic Acid (VITAMIN C) 1000 MG tablet Take 1,000 mg by  mouth daily.  [name] atorvastatin (LIPITOR) 40 MG tablet Take 40 mg by mouth at bedtime.  . carvedilol (COREG) 3.125 MG tablet Take 3.125 mg by mouth 2 (two) times daily.  . cholecalciferol (VITAMIN D3) 25 MCG (1000 UNIT) tablet Take 1,000 Units by mouth daily.  . clonazePAM (KLONOPIN) 0.25 MG disintegrating tablet Take 0.25 mg by mouth daily as needed.  . clopidogrel (PLAVIX) 75 MG tablet Take 75 mg by mouth daily.  [name] DEXILANT 30 MG capsule Take 1 capsule by mouth daily.  . fluticasone (FLONASE) 50 MCG/ACT nasal spray Place 1 spray into both nostrils daily.  . Fluticasone-Umeclidin-Vilant (TRELEGY ELLIPTA) 100-62.5-25 MCG/INH AEPB Inhale 2 puffs into the lungs every 6 (six) hours.  . gabapentin (NEURONTIN) 300 MG capsule Take 600 mg by mouth 2 (two) times daily.  [name] levothyroxine (SYNTHROID) 150 MCG tablet Take 150 mcg by mouth daily.  . montelukast (SINGULAIR) 10 MG tablet Take 10 mg by mouth at bedtime.  . Multiple Vitamins-Minerals (WOMENS 50+ MULTI VITAMIN/MIN) TABS Take 1  tablet by mouth daily.  . sertraline (ZOLOFT) 100 MG tablet Take 100 mg by mouth. TWO TABLETS ( 200 MG ) DAILY  . telmisartan (MICARDIS) 40 MG tablet Take 40 mg by mouth daily.  [name] topiramate (TOPAMAX) 100 MG tablet Take 100 mg by mouth daily.  . traZODone (DESYREL) 50 MG tablet Take 50 mg by mouth daily.  . [DISCONTINUED] ALPRAZolam (XANAX) 0.5 MG tablet Take 0.5 mg by mouth at bedtime as needed for anxiety.     Allergies:   Aspirin, Doxycycline, Tizanidine, Tramadol, Eggs or egg-derived products, and Levofloxacin   Social History   Socioeconomic History  . Marital status: Divorced    Spouse name: Not on file  . Number of children: Not on file  . Years of education: Not on file  . Highest education level: Not on file  Occupational History  . Not on file  Tobacco Use  . Smoking status: Former Smoker    Types: Cigars    Quit date: 07/21/2015    Years since quitting: 5.0  . Smokeless tobacco: Never Used  Substance  and Sexual Activity  . Alcohol use: Not Currently    Comment: occasionally  . Drug use: Never  . Sexual activity: Not on file  Other Topics Concern  . Not on file  Social History Narrative  . Not on file   Social Determinants of Health   Financial Resource Strain: Not on file  Food Insecurity: Not on file  Transportation Needs: Not on file  Physical Activity: Not on file  Stress: Not on file  Social Connections: Not on file     Family History: The patient's family history includes COPD in her mother; Heart Problems in her father; Heart attack in her mother; Hypertension in her brother and sister; Multiple sclerosis in her sister; Pneumonia in her father.  ROS:   ROS Please see the history of present illness.     All other systems reviewed and are negative.  EKGs/Labs/Other Studies Reviewed:    The following studies were reviewed today:   EKG:  EKG is  ordered today.  The ekg ordered today is personally reviewed and demonstrates sinus rhythm normal EKG  Recent Labs: No results found for requested labs within last 8760 hours.  Recent Lipid Panel    Component Value Date/Time   CHOL  04/10/2008 0200    128        ATP III CLASSIFICATION:  <200     mg/dL   Desirable  200-239  mg/dL   Borderline High  >=240    mg/dL   High   TRIG 103 04/10/2008 0200   HDL 48 04/10/2008 0200   CHOLHDL 2.7 04/10/2008 0200   VLDL 21 04/10/2008 0200   LDLCALC  04/10/2008 0200    59        Total Cholesterol/HDL:CHD Risk Coronary Heart Disease Risk Table                     Men   Women  1/2 Average Risk   3.4   3.3    Physical Exam:    VS:  BP 132/80   Pulse 78   Ht 5\' 5"  (1.651 m)   Wt 182 lb 6.4 oz (82.7 kg)   SpO2 96% Comment: 02 @ 3 liters [place]  BMI 30.35 kg/m     Wt Readings from Last 3 Encounters:  08/20/20 182 lb 6.4 oz (82.7 kg)  08/02/20 175 lb (79.4 kg)  GEN: COPD appearance she is hyperinflated.  Well nourished, well developed in no acute distress HEENT:  Normal NECK: No JVD; No carotid bruits LYMPHATICS: No lymphadenopathy CARDIAC: Distant heart sounds P2 is normal RRR, no murmurs, rubs, gallops RESPIRATORY: Overinflated diminished breath sounds very prolonged expiratory phase no rales rhonchi or wheezing ABDOMEN: Soft, non-tender, non-distended MUSCULOSKELETAL:  No edema; No deformity  SKIN: Warm and dry NEUROLOGIC:  Alert and oriented x 3 PSYCHIATRIC:  Normal affect     Signed, Charlotte More, MD  08/20/2020 4:21 PM    [facility]

## 2020-08-20 ENCOUNTER — Other Ambulatory Visit: Payer: Self-pay

## 2020-08-20 ENCOUNTER — Ambulatory Visit (INDEPENDENT_AMBULATORY_CARE_PROVIDER_SITE_OTHER): Payer: Medicare Other | Admitting: Cardiology

## 2020-08-20 ENCOUNTER — Encounter: Payer: Self-pay | Admitting: Cardiology

## 2020-08-20 ENCOUNTER — Ambulatory Visit (INDEPENDENT_AMBULATORY_CARE_PROVIDER_SITE_OTHER): Payer: Medicare Other

## 2020-08-20 VITALS — BP 132/80 | HR 78 | Ht 65.0 in | Wt 182.4 lb

## 2020-08-20 DIAGNOSIS — I10 Essential (primary) hypertension: Secondary | ICD-10-CM

## 2020-08-20 DIAGNOSIS — R002 Palpitations: Secondary | ICD-10-CM | POA: Diagnosis not present

## 2020-08-20 DIAGNOSIS — R0602 Shortness of breath: Secondary | ICD-10-CM

## 2020-08-20 DIAGNOSIS — J449 Chronic obstructive pulmonary disease, unspecified: Secondary | ICD-10-CM | POA: Diagnosis not present

## 2020-08-20 DIAGNOSIS — E785 Hyperlipidemia, unspecified: Secondary | ICD-10-CM

## 2020-08-20 DIAGNOSIS — I2510 Atherosclerotic heart disease of native coronary artery without angina pectoris: Secondary | ICD-10-CM

## 2020-08-20 NOTE — Patient Instructions (Signed)
Medication Instructions:  Your physician recommends that you continue on your current medications as directed. Please refer to the Current Medication list given to you today.  *If you need a refill on your cardiac medications before your next appointment, please call your pharmacy*   Lab Work: Your physician recommends that you return for lab work in: [place] If you have labs (blood work) drawn today and your tests are completely normal, you will receive your results only by: [name] MyChart Message (if you have MyChart) OR . A paper copy in the mail If you have any lab test that is abnormal or we need to change your treatment, we will call you to review the results.   Testing/Procedures: A zio monitor was ordered today. It will remain on for 7 days. You will then return monitor and event diary in provided box. It takes 1-2 weeks for report to be downloaded and returned to [place]. We will call you with the results. If monitor falls off or has orange flashing light, please call Zio for further instructions.      Follow-Up: At [facility], you and your health needs are our priority.  As part of our continuing mission to provide you with exceptional heart care, we have created designated Provider Care Teams.  These Care Teams include your primary Cardiologist (physician) and Advanced Practice Providers (APPs -  Physician Assistants and Nurse Practitioners) who all work together to provide you with the care you need, when you need it.  We recommend signing up for the patient portal called "MyChart".  Sign up information is provided on this After Visit Summary.  MyChart is used to connect with patients for Virtual Visits (Telemedicine).  Patients are able to view lab/test results, encounter notes, upcoming appointments, etc.  Non-urgent messages can be sent to your provider as well.   To learn more about what you can do with MyChart, go to [email or web address].    Your next appointment:   6  week(s)  The format for your next appointment:   In Person  Provider:   Shirlee More, MD   Other Instructions

## 2020-08-21 ENCOUNTER — Telehealth: Payer: Self-pay

## 2020-08-21 LAB — PRO B NATRIURETIC PEPTIDE: NT-Pro BNP: 144 pg/mL (ref 0–287)

## 2020-08-21 NOTE — Telephone Encounter (Signed)
Spoke with patient regarding results and recommendation.  Patient verbalizes understanding and is agreeable to plan of care. Advised patient to call back with any issues or concerns.  

## 2020-08-21 NOTE — Telephone Encounter (Signed)
-----   Message from Richardo Priest, MD sent at 08/21/2020 10:12 AM EST ----- Good result proBNP is normal not indicative of congestive heart failure

## 2020-08-27 DIAGNOSIS — R002 Palpitations: Secondary | ICD-10-CM

## 2020-09-10 ENCOUNTER — Telehealth: Payer: Self-pay

## 2020-09-10 NOTE — Telephone Encounter (Signed)
-----   Message from Richardo Priest, MD sent at 09/08/2020  2:49 PM EST ----- Good result will monitor is normal.

## 2020-09-10 NOTE — Telephone Encounter (Signed)
Spoke with patient regarding results and recommendation.  Patient verbalizes understanding and is agreeable to plan of care. Advised patient to call back with any issues or concerns.  

## 2020-10-02 ENCOUNTER — Ambulatory Visit: Payer: Medicare Other | Admitting: Cardiology

## 2020-11-12 ENCOUNTER — Other Ambulatory Visit: Payer: Self-pay | Admitting: Orthopedic Surgery

## 2020-11-12 DIAGNOSIS — M19131 Post-traumatic osteoarthritis, right wrist: Secondary | ICD-10-CM

## 2020-11-30 ENCOUNTER — Other Ambulatory Visit: Payer: Self-pay

## 2020-11-30 ENCOUNTER — Ambulatory Visit
Admission: RE | Admit: 2020-11-30 | Discharge: 2020-11-30 | Disposition: A | Discharge status: Home / Self Care | Payer: Medicare Other | Source: Ambulatory Visit | Attending: Orthopedic Surgery | Admitting: Orthopedic Surgery

## 2020-11-30 DIAGNOSIS — M19131 Post-traumatic osteoarthritis, right wrist: Secondary | ICD-10-CM

## 2020-11-30 MED ORDER — IOPAMIDOL (ISOVUE-M 200) INJECTION 41%
3.0000 mL | Freq: Once | INTRAMUSCULAR | Status: AC
  Administered 2020-11-30: 3 mL via INTRA_ARTICULAR

## 2020-12-05 DIAGNOSIS — M25831 Other specified joint disorders, right wrist: Secondary | ICD-10-CM | POA: Insufficient documentation

## 2020-12-05 HISTORY — DX: Other specified joint disorders, right wrist: M25.831

## 2022-01-01 ENCOUNTER — Telehealth: Payer: Self-pay

## 2022-01-01 NOTE — Telephone Encounter (Signed)
   Pre-operative Risk Assessment    Patient Name: Charlotte Wallace  DOB: 10/09/1960 MRN: [id number]      Request for Surgical Clearance    Procedure:   Right total knee arthroplasty  Date of Surgery:  Clearance TBD                                 Surgeon:  Joya Salm, MD Surgeon's Group or Practice Name:  [facility] Phone number:  [phone] Fax number:  [phone]   Type of Clearance Requested:   - Medical  - Pharmacy:  Hold Clopidogrel (Plavix)     Type of Anesthesia:  General    Additional requests/questions:   None  Signed, Remmy Riffe   01/01/2022, 5:01 PM

## 2022-01-03 ENCOUNTER — Ambulatory Visit (INDEPENDENT_AMBULATORY_CARE_PROVIDER_SITE_OTHER): Payer: Medicare Other | Admitting: Nurse Practitioner

## 2022-01-03 ENCOUNTER — Encounter: Payer: Self-pay | Admitting: Nurse Practitioner

## 2022-01-03 VITALS — BP 118/78 | HR 75 | Ht 65.0 in | Wt 153.0 lb

## 2022-01-03 DIAGNOSIS — I2510 Atherosclerotic heart disease of native coronary artery without angina pectoris: Secondary | ICD-10-CM | POA: Diagnosis not present

## 2022-01-03 DIAGNOSIS — Z01810 Encounter for preprocedural cardiovascular examination: Secondary | ICD-10-CM | POA: Diagnosis not present

## 2022-01-03 DIAGNOSIS — R002 Palpitations: Secondary | ICD-10-CM | POA: Diagnosis not present

## 2022-01-03 NOTE — Patient Instructions (Signed)
Medication Instructions:   Your physician recommends that you continue on your current medications as directed. Please refer to the Current Medication list given to you today.   *If you need a refill on your cardiac medications before your next appointment, please call your pharmacy*   Lab Work:  None ordered.  If you have labs (blood work) drawn today and your tests are completely normal, you will receive your results only by: [place] (if you have MyChart) OR A paper copy in the mail If you have any lab test that is abnormal or we need to change your treatment, we will call you to review the results.   Testing/Procedures:  None ordered.  Follow-Up: At [facility], you and your health needs are our priority.  As part of our continuing mission to provide you with exceptional heart care, we have created designated Provider Care Teams.  These Care Teams include your primary Cardiologist (physician) and Advanced Practice Providers (APPs -  Physician Assistants and Nurse Practitioners) who all work together to provide you with the care you need, when you need it.  We recommend signing up for the patient portal called "MyChart".  Sign up information is provided on this After Visit Summary.  MyChart is used to connect with patients for Virtual Visits (Telemedicine).  Patients are able to view lab/test results, encounter notes, upcoming appointments, etc.  Non-urgent messages can be sent to your provider as well.   To learn more about what you can do with MyChart, go to [email or web address].    Your next appointment:   1 year(s)  The format for your next appointment:   In Person  Provider:   Shirlee More, MD    Other Instructions  Your physician wants you to follow-up in: 1 year with Dr. Bettina Gavia.  You will receive a reminder letter in the mail two months in advance. If you don't receive a letter, please call our office to schedule the follow-up appointment.   Important  Information About Sugar

## 2022-01-03 NOTE — Telephone Encounter (Signed)
   Name: Charlotte Wallace  DOB: 1961-07-06  MRN: [id number]  Primary Cardiologist: None  Chart reviewed as part of pre-operative protocol coverage. Because of [name]'s past medical history and time since last visit, she will require a follow-up in-office visit in order to better assess preoperative cardiovascular risk.  Pre-op covering staff: - Please schedule appointment and call patient to inform them. If patient already had an upcoming appointment within acceptable timeframe, please add "pre-op clearance" to the appointment notes so provider is aware. - Please contact requesting surgeon's office via preferred method (i.e, phone, fax) to inform them of need for appointment prior to surgery.  Holding of antiplatelet therapy decision will be made at the time of her in person appointment.  Elgie Collard, PA-C  01/03/2022, 8:49 AM

## 2022-01-03 NOTE — Telephone Encounter (Signed)
Pt has appt today 01/03/22

## 2022-01-03 NOTE — Progress Notes (Signed)
Cardiology Office Note:    Date:  01/03/2022   ID:  Charlotte Wallace, DOB Apr 03, 1961, MRN [id number]  PCP:  Imagene Riches, NP   [facility] Providers Cardiologist:  None     Referring MD: Imagene Riches, NP   Chief Complaint: Preoperative cardiac evaluation  History of Present Illness:    Charlotte Wallace is a pleasant 61 y.o. female with a hx of palpitations, hypertension, coronary artery calcification seen on CT scan, COPD, and TIA.   TEE March 2010 showed small amount of shunt with what was described as a tiny patent foramen ovale.  No left atrial thrombus noted.  Carotid duplex [facility] 2017 showed 1 to 39% stenosis of the ICA bilaterally.  She had a nuclear medicine dobutamine Cardiolite done at [facility] November 2016 which was normal with an EF of 57%.  Chest CT without contrast 01/2018 showing left lower lobe consolidation felt to be pneumonia with coronary artery calcification and aortic atherosclerosis noted at that time.  She had an echocardiogram at [facility] 07/19/2020 showing mild concentric LVH, low normal ejection fraction 50 to 55%, mild left atrial enlargement and indeterminate diastolic filling pressures.  RV was normal and pulmonary arterial pressure could not be estimated. She is on chronic O2.   She was last seen in our office on 08/20/2020 by Dr. Bettina Gavia at which time she had worsening shortness of breath. BNP at that visit was normal. Cardiac monitor ordered at that time was unremarkable. She was advised to follow-up in 4 weeks.  She is now pending right total knee arthroplasty and appointment for cardiac clearance was arranged.  Today, she is here with her sister for preoperative cardiac clearance. She is on chronic O2.  Reports she is limited physically by dyspnea. Feels that dyspnea is stable. She denies chest pain, lower extremity edema, fatigue, palpitations, melena, hematuria, hemoptysis, diaphoresis, weakness, presyncope, syncope, orthopnea,  and PND.  She has no specific cardiac complaints.  States she was told she had to come for cardiac clearance prior to surgery.  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   Lumbar degenerative disc disease 01/10/2016   Mixed simple and mucopurulent chronic bronchitis (HCC) 01/10/2016   Muscular deconditioning 02/27/2016   Non-traumatic rhabdomyolysis 02/27/2016   Osteoporosis 09/07/2015   Pneumonia 02/29/2016   Syncope 02/27/2016   TIA (transient ischemic attack)    Vitamin D deficiency 09/07/2015    Past Surgical History:  Procedure Laterality Date   ABDOMINAL HYSTERECTOMY     BACK SURGERY     HERNIA REPAIR     HYSTEROSCOPY     SPLENECTOMY, TOTAL     TOTAL KNEE ARTHROPLASTY      Current Medications: Current Meds  Medication Sig   albuterol (VENTOLIN HFA) 108 (90 Base) MCG/ACT inhaler Inhale 2 puffs into the lungs every 6 (six) hours as needed for wheezing or shortness of breath.   Ascorbic Acid (VITAMIN C) 1000 MG tablet Take 1,000 mg by mouth daily.   atorvastatin (LIPITOR) 40 MG tablet  Take 40 mg by mouth at bedtime.   azelastine (ASTELIN) 0.1 % nasal spray Place 1 spray into the nose in the morning and at bedtime.   buPROPion (WELLBUTRIN) 75 MG tablet Take 75 mg by mouth daily.   carvedilol (COREG) 3.125 MG tablet Take 3.125 mg by mouth 2 (two) times daily.   cetirizine (ZYRTEC) 10 MG tablet Take 10 mg by mouth daily.   clonazePAM (KLONOPIN) 0.25 MG disintegrating tablet Take 0.25 mg by mouth daily as needed.   clopidogrel (PLAVIX) 75 MG  tablet Take 75 mg by mouth daily. M/W/F   DEXILANT 30 MG capsule Take 1 capsule by mouth daily.   Docusate Sodium (DSS) 100 MG CAPS Take 1 tablet by mouth daily.   fluticasone (FLONASE) 50 MCG/ACT nasal spray Place 1 spray into both nostrils daily.   Fluticasone-Umeclidin-Vilant (TRELEGY ELLIPTA) 100-62.5-25 MCG/INH AEPB Inhale 2 puffs into the lungs every 6 (six) hours.   gabapentin (NEURONTIN) 300 MG capsule Take 600 mg by mouth 2 (two) times daily.   hydrOXYzine (ATARAX) 10 MG tablet Take 1 tablet by mouth 3 (three) times daily as needed.   Levothyroxine Sodium 137 MCG CAPS Take 137 mcg by mouth daily.   montelukast (SINGULAIR) 10 MG tablet Take 10 mg by mouth at bedtime.   Multiple Vitamins-Minerals (WOMENS 50+ MULTI VITAMIN/MIN) TABS Take 1 tablet by mouth daily.   Rimegepant Sulfate (NURTEC) 75 MG TBDP Take 1 tablet by mouth daily.   sertraline (ZOLOFT) 100 MG tablet Take 100 mg by mouth. TWO TABLETS ( 200 MG ) DAILY   telmisartan (MICARDIS) 40 MG tablet Take 40 mg by mouth daily.   topiramate (TOPAMAX) 100 MG tablet Take 100 mg by mouth daily.   traZODone (DESYREL) 50 MG tablet Take 50 mg by mouth daily.   vitamin B-12 (CYANOCOBALAMIN) 1000 MCG tablet Take 1,000 mcg by mouth daily.     Allergies:   Aspirin, Doxycycline, Tizanidine, Tramadol, Eggs or egg-derived products, and Levofloxacin   Social History   Socioeconomic History   Marital status: Divorced    Spouse name: Not on file   Number of children: Not on file   Years of education: Not on file   Highest education level: Not on file  Occupational History   Not on file  Tobacco Use   Smoking status: Former    Types: Cigars    Quit date: 07/21/2015    Years since quitting: 6.4   Smokeless tobacco: Never  Substance and Sexual Activity   Alcohol use: Not Currently    Comment: occasionally   Drug use: Never   Sexual activity: Not on file  Other Topics Concern   Not on file  Social History Narrative   Not on file    Social Determinants of Health   Financial Resource Strain: Not on file  Food Insecurity: Not on file  Transportation Needs: Not on file  Physical Activity: Not on file  Stress: Not on file  Social Connections: Not on file     Family History: The patient's family history includes COPD in her mother; Heart Problems in her father; Heart attack in her mother; Hypertension in her brother and sister; Multiple sclerosis in her sister; Pneumonia in her father.  ROS:   Please see the history of present illness.  All other systems reviewed and are negative.  Labs/Other Studies Reviewed:    The following studies were reviewed today:  Cardiac monitor 09/08/20  Patient had a min HR of 52 bpm, max HR of  130 bpm, and avg HR of 69 bpm. Predominant underlying rhythm was Sinus Rhythm. 1 Supraventricular Tachycardia runs occurred, the run with the fastest interval lasting 6 beats with a max rate of 130 bpm, the  longest lasting 6 beats with an avg rate of 119 bpm. Ectopic Atrial Rhythm was present. Isolated SVEs were rare (<1.0%), SVE Couplets were rare (<1.0%), and SVE Triplets were rare (<1.0%). Isolated VEs were rare (<1.0%), VE Couplets were rare (<1.0%),  and no VE Triplets were present. Ventricular Bigeminy and Trigeminy were present.   The cardiac rhythm throughout was sinus with average, minimum maximal heart rates of 69, 53 117 bpm. There were no pauses of 3 seconds or greater and no episodes of sinus node exit block or second or third-degree AV node block. A single diary event was present associated with a single ventricular premature contraction in sinus rhythm. Ventricular ectopy was rare. Supraventricular ectopy was rare there was 1 brief run of atrial premature contractions 6 complexes at a rate of 119 bpm.  There were no episodes of atrial fibrillation or flutter.   Conclusion unremarkable event monitor.   Recent Labs: No recent lab work to review  Risk Assessment/Calculations:       Physical Exam:    VS:  BP 118/78   Pulse 75   Ht '5\' 5"'$  (1.651 m)   Wt 153 lb (69.4 kg)   SpO2 96%   BMI 25.46 kg/m     Wt Readings from Last 3 Encounters:  01/03/22 153 lb (69.4 kg)  08/20/20 182 lb 6.4 oz (82.7 kg)  08/02/20 175 lb (79.4 kg)     GEN:  Well nourished, well developed in no acute distress HEENT: Normal NECK: No JVD; No carotid bruits CARDIAC: RRR, no murmurs, rubs, gallops RESPIRATORY:  Clear to auscultation without rales, wheezing or rhonchi  ABDOMEN: Soft, non-tender, non-distended MUSCULOSKELETAL:  No edema; No deformity. 2+ pedal pulses, equal bilaterally SKIN: Warm and dry NEUROLOGIC:  Alert and oriented x 3 PSYCHIATRIC:  Normal affect   EKG:  EKG is ordered today.  The ekg ordered today demonstrates NSR at 77 bpm, nonspecific ST/T wave abnormality, no acute change from previous  Diagnoses:    No diagnosis found. Assessment and Plan:     Preoperative cardiac evaluation: She is doing well from a cardiac perspective and may proceed to surgery without further testing.  There is no coronary indication for Plavix.  She reports she takes this for history of TIA. According to the Revised Cardiac Risk Index (RCRI), her Perioperative Risk of Major Cardiac Event is (%): 6.6. Her Functional Capacity in METs is: 5.62 according to the [place] Activity Status Index (DASI).  I will forward clearance to requesting provider.  Abnormal EKG: Reports she was initially referred to cardiology for abnormal EKG and had to return today for cardiac clearance due to PCP would not clear her due to EKG.  EKG shows nonspecific ST/T wave abnormality.  There is no acute change from previous tracing.  Coronary calcifications on CT: Denies chest pain. Dyspnea is stable.  No indication for further ischemic evaluation. Does not wish to follow-up with cardiology before at least 1 year.   Palpitations: Quiescent at this time.  Continue carvedilol.   Disposition: 1 year with Dr.  Bettina Gavia   Medication Adjustments/Labs and Tests Ordered: Current medicines are reviewed at length with the patient today.  Concerns regarding medicines are outlined above.  No orders of the defined types were placed in this encounter.  No  orders of the defined types were placed in this encounter.   There are no Patient Instructions on file for this visit.   Signed, Emmaline Life, NP  01/03/2022 1:57 PM    [facility]

## 2022-04-09 LAB — LAB REPORT - SCANNED
A1c: 5.4
EGFR: 71.5

## 2022-06-28 LAB — LAB REPORT - SCANNED: EGFR: 82.2

## 2022-09-26 LAB — LAB REPORT - SCANNED: EGFR: 82.1

## 2022-12-27 LAB — LAB REPORT - SCANNED: EGFR: 83.2

## 2022-12-29 LAB — LAB REPORT - SCANNED: EGFR: 83.2

## 2023-01-19 DIAGNOSIS — R918 Other nonspecific abnormal finding of lung field: Secondary | ICD-10-CM | POA: Diagnosis not present

## 2023-02-04 ENCOUNTER — Encounter: Payer: Self-pay | Admitting: Specialist

## 2023-03-04 NOTE — Progress Notes (Addendum)
Cardiology Office Note:    Date:  03/05/2023   ID:  Charlotte Wallace, DOB 12/03/1960, MRN [id number]  PCP:  Erskine Emery, NP  Cardiologist:  Norman Herrlich, MD    Referring MD: Erskine Emery, NP    ASSESSMENT:    1. Chest pain of uncertain etiology   2. Primary hypertension   3. Coronary artery calcification seen on CAT scan   4. Chronic obstructive pulmonary disease, unspecified COPD type (HCC)   5. 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 She remained symptomatic and started oral nitrates sublingual nitroglycerin continue beta-blocker clopidogrel and her statin.  She already has an appointment to come the office October 10 I gave her my advice she should undergo coronary arteriography she said that she would give thought to it and should be seen in the office as scheduled.  Next appointment: 6 weeks   Medication Adjustments/Labs and Tests Ordered: Current medicines are reviewed at length with the patient today.  Concerns regarding medicines are outlined above.  Orders Placed This Encounter  Procedures   EKG 12-Lead   No orders of the defined types were placed in this encounter.    History of Present Illness:    Charlotte Wallace is a 62 y.o. female with a hx of hypertension coronary artery calcification COPD TIA last seen 01/03/2022 and preoperative evaluation.  Compliance with diet, lifestyle and medications: Yes  Her  sister is present participates in evaluation decision making He is referred back to my office she had another CT scan done 11/27/2022 she was told she had coronary artery disease and that she had a widow Cytogeneticist. Initially she tells me she is having no chest pain her sister interjects that she does not know what she is describing recurrent episodes of left precordial chest pain sharp as well as severe heavy last up to 10 to 15 minutes occurs several times a week and actually with increased frequency she tries Tums that does not give her relief and resolved spontaneously and occurs with and without physical effort certainly consistent with possible angina Has chronic shortness of breath that is stable is not having edema orthopnea palpitation or syncope She continues to take clopidogrel as well as a statin.  I saw the patient along with Wallis Bamberg, NP during discussion of CTA findings ongoing symptoms recommendation for coronary angiography risk options and benefits. 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   Ascorbic Acid (VITAMIN C) 1000 MG tablet Take 1,000 mg by mouth daily.   atorvastatin (LIPITOR) 40 MG tablet Take 40 mg by mouth at bedtime.   azelastine (ASTELIN) 0.1 % nasal spray Place 1 spray into the nose in the morning and at bedtime.   buPROPion (WELLBUTRIN) 75 MG tablet Take 75 mg by mouth daily.   carvedilol (COREG) 3.125 MG tablet Take 3.125 mg by mouth 2 (two) times daily.   cetirizine (ZYRTEC) 10 MG tablet Take 10 mg by mouth daily.   clonazePAM (KLONOPIN) 0.25 MG disintegrating tablet Take 0.25 mg by mouth See admin instructions. 1 tablet in am and 2 tablets in pm   clopidogrel (PLAVIX) 75 MG tablet Take 75 mg by mouth daily. M/W/F   DEXILANT 30 MG capsule Take 1 capsule by mouth daily.   Docusate Sodium (DSS) 100 MG CAPS Take 1 tablet by mouth daily.   fluticasone (FLONASE) 50 MCG/ACT nasal spray Place 1 spray into both nostrils daily.   Fluticasone-Umeclidin-Vilant (TRELEGY ELLIPTA) 100-62.5-25 MCG/INH AEPB Inhale 2 puffs into the lungs every 6 (six) hours.   gabapentin (NEURONTIN) 300 MG capsule Take 600 mg by mouth 2 (two) times daily.   hydrOXYzine (ATARAX) 10 MG tablet Take 1 tablet by mouth 3 (three) times daily as needed for nausea or vomiting.   Levothyroxine Sodium 137 MCG CAPS Take 137 mcg by mouth daily.   montelukast (SINGULAIR) 10 MG tablet Take 10 mg by mouth at bedtime.   Multiple Vitamins-Minerals (WOMENS 50+ MULTI VITAMIN/MIN) TABS Take 1 tablet by mouth daily.   Rimegepant Sulfate (NURTEC) 75 MG TBDP Take 1 tablet by mouth daily.   sertraline (ZOLOFT) 100 MG tablet Take 100 mg by mouth daily. TWO TABLETS ( 200 MG ) DAILY   telmisartan (MICARDIS) 40 MG tablet Take 40 mg by mouth daily.   topiramate (TOPAMAX) 100 MG tablet Take 100 mg by mouth daily.   vitamin B-12 (CYANOCOBALAMIN) 1000 MCG tablet Take 1,000 mcg by mouth daily.   [DISCONTINUED]  traZODone (DESYREL) 50 MG tablet Take 50 mg by mouth daily.      EKGs/Labs/Other Studies Reviewed:    The following studies were reviewed today:  Cardiac Studies & Procedures         MONITORS  LONG TERM MONITOR (3-14 DAYS) 09/25/2022           EKG Interpretation Date/Time:  Thursday March 05 2023 10:55:28 EDT Ventricular Rate:  72 PR Interval:  186 QRS Duration:  92 QT Interval:  386 QTC Calculation: 422 R Axis:   56  Text Interpretation: Normal sinus rhythm Normal ECG When compared with ECG of 07-Feb-2010 11:53, No significant change was found Confirmed by Norman Herrlich ([id number]) on 03/05/2023 11:00:20 AM   Recent Labs: No results found for requested labs within last 365 days.  Recent Lipid Panel    Component Value Date/Time   CHOL  04/10/2008 0200    128        ATP III CLASSIFICATION:  <200  mg/dL   Desirable  355-732  mg/dL   Borderline High  >=202    mg/dL   High   TRIG 542 70/62/3762 0200   HDL 48 04/10/2008 0200   CHOLHDL 2.7 04/10/2008 0200   VLDL 21 04/10/2008 0200   LDLCALC  04/10/2008 0200    59        Total Cholesterol/HDL:CHD Risk Coronary Heart Disease Risk Table                     Men   Women  1/2 Average Risk   3.4   3.3    Physical Exam:    VS:  BP (!) 140/82 (BP Location: Right Arm, Patient Position: Sitting)   Pulse 74   Ht 5\' 5"  (1.651 m)   Wt 167 lb 9.6 oz (76 kg)   SpO2 98%   BMI 27.89 kg/m     Wt Readings from Last 3 Encounters:  03/05/23 167 lb 9.6 oz (76 kg)  01/03/22 153 lb (69.4 kg)  08/20/20 182 lb 6.4 oz (82.7 kg)     GEN:  Well nourished, well developed in no acute distress HEENT: Normal NECK: No JVD; No carotid bruits LYMPHATICS: No lymphadenopathy CARDIAC: RRR, no murmurs, rubs, gallops RESPIRATORY:  Clear to auscultation without rales, wheezing or rhonchi  ABDOMEN: Soft, non-tender, non-distended MUSCULOSKELETAL:  No edema; No deformity  SKIN: Warm and dry NEUROLOGIC:  Alert and oriented x 3 PSYCHIATRIC:   Normal affect    Signed, Norman Herrlich, MD  03/05/2023 11:11 AM    [facility]

## 2023-03-05 ENCOUNTER — Encounter: Payer: Self-pay | Admitting: Cardiology

## 2023-03-05 ENCOUNTER — Ambulatory Visit: Payer: 59 | Attending: Cardiology | Admitting: Cardiology

## 2023-03-05 VITALS — BP 140/82 | HR 74 | Ht 65.0 in | Wt 167.6 lb

## 2023-03-05 DIAGNOSIS — I2510 Atherosclerotic heart disease of native coronary artery without angina pectoris: Secondary | ICD-10-CM | POA: Diagnosis not present

## 2023-03-05 DIAGNOSIS — R072 Precordial pain: Secondary | ICD-10-CM

## 2023-03-05 DIAGNOSIS — I10 Essential (primary) hypertension: Secondary | ICD-10-CM

## 2023-03-05 DIAGNOSIS — E782 Mixed hyperlipidemia: Secondary | ICD-10-CM

## 2023-03-05 DIAGNOSIS — R079 Chest pain, unspecified: Secondary | ICD-10-CM | POA: Diagnosis not present

## 2023-03-05 DIAGNOSIS — J449 Chronic obstructive pulmonary disease, unspecified: Secondary | ICD-10-CM

## 2023-03-05 MED ORDER — METOPROLOL TARTRATE 100 MG PO TABS: 100.0000 mg | ORAL_TABLET | Freq: Once | ORAL | 0 refills | Status: DC

## 2023-03-05 NOTE — Addendum Note (Signed)
Addended by: Roxanne Mins I on: 03/05/2023 11:43 AM   Modules accepted: Orders

## 2023-03-05 NOTE — Patient Instructions (Signed)
Medication Instructions:  Your physician recommends that you continue on your current medications as directed. Please refer to the Current Medication list given to you today.  *If you need a refill on your cardiac medications before your next appointment, please call your pharmacy*   Lab Work: Your physician recommends that you return for lab work in:   Labs today: BMP  If you have labs (blood work) drawn today and your tests are completely normal, you will receive your results only by: MyChart Message (if you have MyChart) OR A paper copy in the mail If you have any lab test that is abnormal or we need to change your treatment, we will call you to review the results.   Testing/Procedures: Please follow these instructions carefully (unless otherwise directed):  An IV will be required for this test and Nitroglycerin will be given.  Hold all erectile dysfunction medications at least 3 days (72 hrs) prior to test. (Ie viagra, cialis, sildenafil, tadalafil, etc)   On the Night Before the Test: Be sure to Drink plenty of water. Do not consume any caffeinated/decaffeinated beverages or chocolate 12 hours prior to your test. Do not take any antihistamines 12 hours prior to your test.  On the Day of the Test: Drink plenty of water until 1 hour prior to the test. Do not eat any food 1 hour prior to test. You may take your regular medications prior to the test.  Take metoprolol (Lopressor) two hours prior to test. FEMALES- please wear underwire-free bra if available, avoid dresses & tight clothing      After the Test: Drink plenty of water. After receiving IV contrast, you may experience a mild flushed feeling. This is normal. On occasion, you may experience a mild rash up to 24 hours after the test. This is not dangerous. If this occurs, you can take Benadryl 25 mg and increase your fluid intake. If you experience trouble breathing, this can be serious. If it is severe call 911  IMMEDIATELY. If it is mild, please call our office. If you take any of these medications: Glipizide/Metformin, Avandament, Glucavance, please do not take 48 hours after completing test unless otherwise instructed.  We will call to schedule your test 2-4 weeks out understanding that some insurance companies will need an authorization prior to the service being performed.   Follow-Up: At [facility], you and your health needs are our priority.  As part of our continuing mission to provide you with exceptional heart care, we have created designated Provider Care Teams.  These Care Teams include your primary Cardiologist (physician) and Advanced Practice Providers (APPs -  Physician Assistants and Nurse Practitioners) who all work together to provide you with the care you need, when you need it.  We recommend signing up for the patient portal called "MyChart".  Sign up information is provided on this After Visit Summary.  MyChart is used to connect with patients for Virtual Visits (Telemedicine).  Patients are able to view lab/test results, encounter notes, upcoming appointments, etc.  Non-urgent messages can be sent to your provider as well.   To learn more about what you can do with MyChart, go to [email or web address].    Your next appointment:   6 week(s)  Provider:   Norman Herrlich, MD    Other Instructions None

## 2023-03-06 LAB — BASIC METABOLIC PANEL
BUN/Creatinine Ratio: 12 (ref 12–28)
BUN: 11 mg/dL (ref 8–27)
CO2: 23 mmol/L (ref 20–29)
Calcium: 9.8 mg/dL (ref 8.7–10.3)
Chloride: 103 mmol/L (ref 96–106)
Creatinine, Ser: 0.89 mg/dL (ref 0.57–1.00)
Glucose: 87 mg/dL (ref 70–99)
Potassium: 4.5 mmol/L (ref 3.5–5.2)
Sodium: 140 mmol/L (ref 134–144)
eGFR: 72 mL/min/{1.73_m2} (ref >59)

## 2023-04-03 ENCOUNTER — Other Ambulatory Visit: Payer: Self-pay

## 2023-04-03 ENCOUNTER — Telehealth: Payer: Self-pay

## 2023-04-03 MED ORDER — NITROGLYCERIN 0.4 MG SL SUBL: 0.4000 mg | SUBLINGUAL_TABLET | SUBLINGUAL | 1 refills | Status: AC | PRN

## 2023-04-03 MED ORDER — ISOSORBIDE MONONITRATE ER 30 MG PO TB24: 30.0000 mg | ORAL_TABLET | Freq: Every day | ORAL | 3 refills | Status: DC

## 2023-04-03 NOTE — Telephone Encounter (Signed)
Received the following message from Dr. Dulce Sellar:  "I called her with her cardiac CTA report [facility] Dr. Kirtland Bouchard had sent me a message  She needs nitroglycerin as needed and started Imdur 30 mg daily she has an appointment October 10 and I advised her she elective coronary angiography.  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 Hulen Shouts recommendations. Nitroglycerin and Imdur ordered via Epic and sent to the patient's pharmacy. Patient verbalized understanding and had no further questions at this time.

## 2023-04-15 NOTE — Progress Notes (Unsigned)
  Cardiology Office Note:  .   Date:  04/15/2023  ID:  Charlotte Wallace, DOB [birth year], MRN [id number] PCP: Erskine Emery, NP  [facility] Providers Cardiologist:  None { Click to update primary MD,subspecialty MD or APP then REFRESH:1}   History of Present Illness: Charlotte Wallace Kitchen   Charlotte Wallace is a 62 y.o. female with a past medical history of CAD per coronary CTA, hypertension, TIA, COPD, hypothyroidism, syncope.  03/31/2023 coronary CTA calcium score of 1766, 99th percentile, moderate stenosis in the proximal to mid LAD, proximal to mid RCA, FFR was negative for hemodynamic significance. 09/25/2022 monitor Revealed an average heart rate of 63 bpm, predominantly sinus rhythm with PVCs 2% burden  Most recently evaluated by Dr. Dulce Sellar on 03/05/2023 for chest pain, the decision was made to proceed with coronary CTA. She underwent a coronary CTA on 03/31/2023 revealing a calcium score of 1766, 99th percentile, moderate stenosis in the proximal to mid LAD, proximal to mid RCA, FFR was negative for hemodynamic significance.  Dr. Dulce Sellar feels she should undergo a left heart cath  ROS: ROS   Studies Reviewed: .        Cardiac Studies & Procedures         MONITORS  LONG TERM MONITOR (3-14 DAYS) 09/25/2022           Risk Assessment/Calculations:   {Does this patient have ATRIAL FIBRILLATION?[phone]} No BP recorded.  {Refresh Note OR Click here to enter BP  :1}***       Physical Exam:   VS:  There were no vitals taken for this visit.   Wt Readings from Last 3 Encounters:  03/05/23 167 lb 9.6 oz (76 kg)  01/03/22 153 lb (69.4 kg)  08/20/20 182 lb 6.4 oz (82.7 kg)    GEN: Well nourished, well developed in no acute distress NECK: No JVD; No carotid bruits CARDIAC: ***RRR, no murmurs, rubs, gallops RESPIRATORY:  Clear to auscultation without rales, wheezing or rhonchi  ABDOMEN: Soft, non-tender, non-distended EXTREMITIES:  No edema; No deformity   ASSESSMENT AND PLAN: .   Coronary  artery disease-coronary CTA revealed a calcium score 1766    {Are you ordering a CV Procedure (e.g. stress test, cath, DCCV, TEE, etc)?   Press F2        :[id number]}  Dispo: ***  Signed, Flossie Dibble, NP

## 2023-04-15 NOTE — H&P (View-Only) (Signed)
Cardiology Office Note:  .   Date:  04/16/2023  ID:  Charlotte Wallace, DOB 09/22/1960, MRN [id number] PCP: Erskine Emery, NP  [facility] Providers Cardiologist:  None    History of Present Illness: Charlotte Wallace   Charlotte Wallace is a 62 y.o. female with a past medical history of CAD per coronary CTA, hypertension, TIA, COPD on O2 3 LPM, hypothyroidism, syncope.  03/31/2023 coronary CTA calcium score of 1766, 99th percentile, moderate stenosis in the proximal to mid LAD, proximal to mid RCA, FFR was negative for hemodynamic significance. 09/25/2022 monitor Revealed an average heart rate of 63 bpm, predominantly sinus rhythm with PVCs 2% burden  Most recently evaluated by Dr. Dulce Sellar on 03/05/2023 for chest pain, the decision was made to proceed with coronary CTA. She underwent a coronary CTA on 03/31/2023 revealing a calcium score of 1766, 99th percentile, moderate stenosis in the proximal to mid LAD, proximal to mid RCA, FFR was negative for hemodynamic significance.  She presents today accompanied by her daughter for follow-up after abnormal coronary CTA.  She is visibly anxious this, endorses that she is nervous about the information she is coming here.  Her blood pressure is elevated and she attributes this directly to her anxiety surrounding today's visit.  She continues to have episodes of chest pain frequently throughout the day.  She had been started on Imdur, this has cut down on her episodes of angina somewhat. In spite of medication management she continues to have chest pain. We discussed proceeding with left heart catheterization and she would like to proceed. She denies palpitations, pnd, orthopnea, n, v, dizziness, syncope, edema, weight gain, or early satiety.   ROS: Review of Systems  Constitutional:  Positive for malaise/fatigue.  HENT: Negative.    Eyes: Negative.   Respiratory:  Positive for shortness of breath.   Cardiovascular:  Positive for chest pain.  Gastrointestinal:  Negative.   Genitourinary: Negative.   Musculoskeletal: Negative.   Skin: Negative.   Neurological: Negative.   Endo/Heme/Allergies: Negative.   Psychiatric/Behavioral: Negative.       Studies Reviewed: Charlotte Wallace   EKG Interpretation Date/Time:  Thursday April 16 2023 13:57:26 EDT Ventricular Rate:  71 PR Interval:  184 QRS Duration:  90 QT Interval:  388 QTC Calculation: 421 R Axis:   48  Text Interpretation: Normal sinus rhythm Normal ECG When compared with ECG of 05-Mar-2023 10:55, No significant change was found Confirmed by Charlotte Wallace [phone]) on 04/16/2023 2:03:45 PM    Cardiac Studies & Procedures         MONITORS  LONG TERM MONITOR (3-14 DAYS) 09/25/2022           Risk Assessment/Calculations:     HYPERTENSION CONTROL Vitals:   04/16/23 1316 04/16/23 1409  BP: (!) 170/90 (!) 152/82    The patient's blood pressure is elevated above target today.  In order to address the patient's elevated BP: Blood pressure will be monitored at home to determine if medication changes need to be made.          Physical Exam:   VS:  BP (!) 152/82   Pulse 93   Ht 5\' 5"  (1.651 m)   Wt 172 lb (78 kg)   SpO2 93%   BMI 28.62 kg/m    Wt Readings from Last 3 Encounters:  04/16/23 172 lb (78 kg)  03/05/23 167 lb 9.6 oz (76 kg)  01/03/22 153 lb (69.4 kg)    GEN: Visibly anxious, no acute distress  NECK: No JVD; No carotid bruits CARDIAC: RRR, no murmurs, rubs, gallops RESPIRATORY:  Clear to auscultation without rales, wheezing or rhonchi  ABDOMEN: Soft, non-tender, non-distended EXTREMITIES:  No edema; No deformity   ASSESSMENT AND PLAN: .   Coronary artery disease-coronary CTA revealed a calcium score 1766.  She continues to have episode of angina in spite of starting on Imdur.  Continue Plavix 75 mg daily--she is on this for TIA, start aspirin 81 mg daily, continue Lipitor 40 mg daily, continue carvedilol 3.125 mg twice daily, continue isosorbide 30 mg daily.  Discussed with  primary cardiologist Dr. Dulce Sellar, shared decision making with provider and the patient and she would like to proceed with a left heart cath.  Explained risks to include bleeding, AKI, MI, stroke, even death, she understands the risks as well as the benefits and would like to proceed with a left heart catheterization.  Hypertension-blood pressure is elevated in the office today 152/82 however she is very anxious and verbalizes this.  Continue Micardis 40 mg daily, continue carvedilol 3.125 mg twice daily.  COPD-followed by local pulmonology physician, currently on oxygen 3 LPM continuously.  Dyslipidemia-monitored by her PCP, states it was just collected in the last few weeks will reach out to their office to request that they send a copy to [place].  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     Informed Consent   Shared Decision Making/Informed Consent The risks [stroke (1 in 1000), death (1 in 1000), kidney failure [usually temporary] (1 in 500), bleeding (1 in 200), allergic reaction [possibly serious] (1 in 200)], benefits (diagnostic support and management of coronary artery disease) and alternatives of a cardiac catheterization were discussed in detail with Ms. Cregan and she is willing to proceed.     Dispo: CBC, BMET.  Proceed with left heart cath as outlined above.  Follow-up with general cardiology 2 weeks after her left heart cath.  Signed, Flossie Dibble, NP

## 2023-04-16 ENCOUNTER — Encounter: Payer: Self-pay | Admitting: Specialist

## 2023-04-16 ENCOUNTER — Encounter: Payer: Self-pay | Admitting: Cardiology

## 2023-04-16 ENCOUNTER — Ambulatory Visit: Payer: 59 | Attending: Cardiology | Admitting: Cardiology

## 2023-04-16 VITALS — BP 152/82 | HR 93 | Ht 65.0 in | Wt 172.0 lb

## 2023-04-16 DIAGNOSIS — R079 Chest pain, unspecified: Secondary | ICD-10-CM

## 2023-04-16 DIAGNOSIS — J449 Chronic obstructive pulmonary disease, unspecified: Secondary | ICD-10-CM | POA: Diagnosis not present

## 2023-04-16 DIAGNOSIS — I25119 Atherosclerotic heart disease of native coronary artery with unspecified angina pectoris: Secondary | ICD-10-CM

## 2023-04-16 DIAGNOSIS — I10 Essential (primary) hypertension: Secondary | ICD-10-CM | POA: Diagnosis not present

## 2023-04-16 DIAGNOSIS — Z01812 Encounter for preprocedural laboratory examination: Secondary | ICD-10-CM

## 2023-04-16 DIAGNOSIS — E785 Hyperlipidemia, unspecified: Secondary | ICD-10-CM

## 2023-04-16 MED ORDER — ASPIRIN 81 MG PO TBEC: 81.0000 mg | DELAYED_RELEASE_TABLET | Freq: Every day | ORAL | Status: DC

## 2023-04-16 NOTE — Patient Instructions (Signed)
Medication Instructions:  Your physician recommends that you continue on your current medications as directed. Please refer to the Current Medication list given to you today.  *If you need a refill on your cardiac medications before your next appointment, please call your pharmacy*   Lab Work: Your physician recommends that you return for lab work in: Today for BMP and CBC  If you have labs (blood work) drawn today and your tests are completely normal, you will receive your results only by: MyChart Message (if you have MyChart) OR A paper copy in the mail If you have any lab test that is abnormal or we need to change your treatment, we will call you to review the results.   Testing/Procedures:  [place] [facility] DEPT OF [facility] [place] [address] [place] [address] Dept: [phone] Loc: [phone]  EMMELIA Wallace  04/16/2023  You are scheduled for a Cardiac Catheterization on Friday, October 18 with Dr.  Jacinto Halim .  1. Please arrive at the [facility] (Main Entrance A) at [employer]: [address] [place], [place] [address] at 8:30 AM (This time is 2 hour(s) before your procedure to ensure your preparation). Free valet parking service is available. You will check in at ADMITTING. The support person will be asked to wait in the waiting room.  It is OK to have someone drop you off and come back when you are ready to be discharged.    Special note: Every effort is made to have your procedure done on time. Please understand that emergencies sometimes delay scheduled procedures.  2. Diet: Do not eat solid foods after midnight.  The patient may have clear liquids until 5am upon the day of the procedure.  3. Labs: Today  4. Medication instructions in preparation for your procedure:   Contrast Allergy: No   Stop taking, Micardis (Telmisartan) Thursday, October 17,      On the morning of your procedure,  take your Aspirin 81 mg and Plavix any morning medicines NOT listed above.  You may use sips of water.  5. Plan to go home the same day, you will only stay overnight if medically necessary. 6. Bring a current list of your medications and current insurance cards. 7. You MUST have a responsible person to drive you home. 8. Someone MUST be with you the first 24 hours after you arrive home or your discharge will be delayed. 9. Please wear clothes that are easy to get on and off and wear slip-on shoes.  Thank you for allowing [place] to care for you!   -- [place] [facility]    Follow-Up: At [facility], you and your health needs are our priority.  As part of our continuing mission to provide you with exceptional heart care, we have created designated Provider Care Teams.  These Care Teams include your primary Cardiologist (physician) and Advanced Practice Providers (APPs -  Physician Assistants and Nurse Practitioners) who all work together to provide you with the care you need, when you need it.  We recommend signing up for the patient portal called "MyChart".  Sign up information is provided on this After Visit Summary.  MyChart is used to connect with patients for Virtual Visits (Telemedicine).  Patients are able to view lab/test results, encounter notes, upcoming appointments, etc.  Non-urgent messages can be sent to your provider as well.   To learn more about what you can  do with MyChart, go to [email or web address].    Your next appointment:   3 week(s)  Provider:   Wallis Bamberg, NP Rosalita Levan)    Other Instructions

## 2023-04-17 LAB — BASIC METABOLIC PANEL WITH GFR
BUN/Creatinine Ratio: 12 (ref 12–28)
BUN: 12 mg/dL (ref 8–27)
CO2: 21 mmol/L (ref 20–29)
Calcium: 9.5 mg/dL (ref 8.7–10.3)
Chloride: 104 mmol/L (ref 96–106)
Creatinine, Ser: 0.97 mg/dL (ref 0.57–1.00)
Glucose: 98 mg/dL (ref 70–99)
Potassium: 4.2 mmol/L (ref 3.5–5.2)
Sodium: 139 mmol/L (ref 134–144)
eGFR: 65 mL/min/1.73

## 2023-04-17 LAB — CBC WITH DIFFERENTIAL/PLATELET
Basophils Absolute: 0.1 x10E3/uL (ref 0.0–0.2)
Basos: 1 %
EOS (ABSOLUTE): 0.2 x10E3/uL (ref 0.0–0.4)
Eos: 2 %
Hematocrit: 37.3 % (ref 34.0–46.6)
Hemoglobin: 11.8 g/dL (ref 11.1–15.9)
Immature Grans (Abs): 0 x10E3/uL (ref 0.0–0.1)
Immature Granulocytes: 0 %
Lymphocytes Absolute: 3.5 x10E3/uL — ABNORMAL HIGH (ref 0.7–3.1)
Lymphs: 44 %
MCH: 31.6 pg (ref 26.6–33.0)
MCHC: 31.6 g/dL (ref 31.5–35.7)
MCV: 100 fL — ABNORMAL HIGH (ref 79–97)
Monocytes Absolute: 0.7 x10E3/uL (ref 0.1–0.9)
Monocytes: 9 %
Neutrophils Absolute: 3.5 x10E3/uL (ref 1.4–7.0)
Neutrophils: 44 %
Platelets: 311 x10E3/uL (ref 150–450)
RBC: 3.74 x10E6/uL — ABNORMAL LOW (ref 3.77–5.28)
RDW: 11.8 % (ref 11.7–15.4)
WBC: 8 x10E3/uL (ref 3.4–10.8)

## 2023-04-22 ENCOUNTER — Encounter: Payer: Self-pay | Admitting: Cardiology

## 2023-04-23 ENCOUNTER — Telehealth: Payer: Self-pay | Admitting: *Deleted

## 2023-04-23 NOTE — Telephone Encounter (Signed)
Cardiac Catheterization scheduled at [facility] for: Friday April 24, 2023 10:30 AM Arrival time [facility] Main Entrance A at: 8:30 AM  Nothing to eat after midnight prior to procedure, clear liquids until 5 AM day of procedure.   Medication instructions: -Usual morning medications can be taken with sips of water including Plavix 75 mg.   Patient is allergic to aspirin.  Plan to go home the same day, you will only stay overnight if medically necessary.  You must have responsible adult to drive you home.  Someone must be with you the first 24 hours after you arrive home.  Reviewed procedure instructions with patient.

## 2023-04-24 ENCOUNTER — Encounter (HOSPITAL_COMMUNITY)
Admission: RE | Disposition: A | Discharge status: Home / Self Care | Payer: Self-pay | Source: Home / Self Care | Attending: Cardiology

## 2023-04-24 ENCOUNTER — Ambulatory Visit (HOSPITAL_COMMUNITY)
Admission: RE | Admit: 2023-04-24 | Discharge: 2023-04-24 | Disposition: A | Discharge status: Home / Self Care | Payer: 59 | Attending: Cardiology | Admitting: Cardiology

## 2023-04-24 ENCOUNTER — Other Ambulatory Visit: Payer: Self-pay

## 2023-04-24 DIAGNOSIS — I10 Essential (primary) hypertension: Secondary | ICD-10-CM | POA: Insufficient documentation

## 2023-04-24 DIAGNOSIS — Z7902 Long term (current) use of antithrombotics/antiplatelets: Secondary | ICD-10-CM | POA: Diagnosis not present

## 2023-04-24 DIAGNOSIS — Z9981 Dependence on supplemental oxygen: Secondary | ICD-10-CM | POA: Insufficient documentation

## 2023-04-24 DIAGNOSIS — E039 Hypothyroidism, unspecified: Secondary | ICD-10-CM | POA: Diagnosis not present

## 2023-04-24 DIAGNOSIS — I700 Atherosclerosis of aorta: Secondary | ICD-10-CM | POA: Insufficient documentation

## 2023-04-24 DIAGNOSIS — J449 Chronic obstructive pulmonary disease, unspecified: Secondary | ICD-10-CM | POA: Insufficient documentation

## 2023-04-24 DIAGNOSIS — Z79899 Other long term (current) drug therapy: Secondary | ICD-10-CM | POA: Insufficient documentation

## 2023-04-24 DIAGNOSIS — E785 Hyperlipidemia, unspecified: Secondary | ICD-10-CM | POA: Insufficient documentation

## 2023-04-24 DIAGNOSIS — Z8673 Personal history of transient ischemic attack (TIA), and cerebral infarction without residual deficits: Secondary | ICD-10-CM | POA: Diagnosis not present

## 2023-04-24 DIAGNOSIS — I2510 Atherosclerotic heart disease of native coronary artery without angina pectoris: Secondary | ICD-10-CM | POA: Diagnosis not present

## 2023-04-24 DIAGNOSIS — R079 Chest pain, unspecified: Secondary | ICD-10-CM

## 2023-04-24 DIAGNOSIS — I25119 Atherosclerotic heart disease of native coronary artery with unspecified angina pectoris: Secondary | ICD-10-CM | POA: Insufficient documentation

## 2023-04-24 HISTORY — PX: LEFT HEART CATH AND CORONARY ANGIOGRAPHY: CATH118249

## 2023-04-24 SURGERY — LEFT HEART CATH AND CORONARY ANGIOGRAPHY
Anesthesia: LOCAL

## 2023-04-24 MED ORDER — HEPARIN SODIUM (PORCINE) 1000 UNIT/ML IJ SOLN
INTRAMUSCULAR | Status: AC
  Filled 2023-04-24: qty 10

## 2023-04-24 MED ORDER — SODIUM CHLORIDE 0.9 % WEIGHT BASED INFUSION
3.0000 mL/kg/h | INTRAVENOUS | Status: AC
  Administered 2023-04-24: 3 mL/kg/h via INTRAVENOUS

## 2023-04-24 MED ORDER — FENTANYL CITRATE (PF) 100 MCG/2ML IJ SOLN
INTRAMUSCULAR | Status: AC
  Filled 2023-04-24: qty 2

## 2023-04-24 MED ORDER — ISOSORBIDE MONONITRATE ER 60 MG PO TB24: 60.0000 mg | ORAL_TABLET | Freq: Every day | ORAL | 0 refills | Status: DC

## 2023-04-24 MED ORDER — HEPARIN (PORCINE) IN NACL 2000-0.9 UNIT/L-% IV SOLN
INTRAVENOUS | Status: DC | PRN
  Administered 2023-04-24: 1000 mL

## 2023-04-24 MED ORDER — MIDAZOLAM HCL 2 MG/2ML IJ SOLN
INTRAMUSCULAR | Status: AC
  Filled 2023-04-24: qty 2

## 2023-04-24 MED ORDER — LIDOCAINE HCL (PF) 1 % IJ SOLN
INTRAMUSCULAR | Status: AC
  Filled 2023-04-24: qty 30

## 2023-04-24 MED ORDER — SODIUM CHLORIDE 0.9 % IV SOLN: 250.0000 mL | INTRAVENOUS | Status: DC | PRN

## 2023-04-24 MED ORDER — VERAPAMIL HCL 2.5 MG/ML IV SOLN
INTRAVENOUS | Status: DC | PRN
  Administered 2023-04-24: 5 mL via INTRA_ARTERIAL

## 2023-04-24 MED ORDER — ACETAMINOPHEN 325 MG PO TABS: 650.0000 mg | ORAL_TABLET | ORAL | Status: DC | PRN

## 2023-04-24 MED ORDER — SODIUM CHLORIDE 0.9 % WEIGHT BASED INFUSION: 1.0000 mL/kg/h | INTRAVENOUS | Status: AC

## 2023-04-24 MED ORDER — CARVEDILOL 6.25 MG PO TABS: 6.2500 mg | ORAL_TABLET | Freq: Two times a day (BID) | ORAL | 0 refills | Status: DC

## 2023-04-24 MED ORDER — ONDANSETRON HCL 4 MG/2ML IJ SOLN: 4.0000 mg | Freq: Four times a day (QID) | INTRAMUSCULAR | Status: DC | PRN

## 2023-04-24 MED ORDER — HEPARIN SODIUM (PORCINE) 1000 UNIT/ML IJ SOLN
INTRAMUSCULAR | Status: DC | PRN
  Administered 2023-04-24: 4000 [IU] via INTRAVENOUS

## 2023-04-24 MED ORDER — FENTANYL CITRATE (PF) 100 MCG/2ML IJ SOLN
INTRAMUSCULAR | Status: DC | PRN
  Administered 2023-04-24: 25 ug via INTRAVENOUS

## 2023-04-24 MED ORDER — IOHEXOL 350 MG/ML SOLN
INTRAVENOUS | Status: DC | PRN
  Administered 2023-04-24: 40 mL

## 2023-04-24 MED ORDER — SODIUM CHLORIDE 0.9 % WEIGHT BASED INFUSION: 1.0000 mL/kg/h | INTRAVENOUS | Status: DC

## 2023-04-24 MED ORDER — SODIUM CHLORIDE 0.9% FLUSH: 3.0000 mL | INTRAVENOUS | Status: DC | PRN

## 2023-04-24 MED ORDER — LIDOCAINE HCL (PF) 1 % IJ SOLN
INTRAMUSCULAR | Status: DC | PRN
  Administered 2023-04-24: 2 mL

## 2023-04-24 MED ORDER — SODIUM CHLORIDE 0.9% FLUSH: 3.0000 mL | Freq: Two times a day (BID) | INTRAVENOUS | Status: DC

## 2023-04-24 MED ORDER — CLOPIDOGREL BISULFATE 75 MG PO TABS: 75.0000 mg | ORAL_TABLET | ORAL | Status: DC

## 2023-04-24 MED ORDER — MIDAZOLAM HCL 2 MG/2ML IJ SOLN
INTRAMUSCULAR | Status: DC | PRN
  Administered 2023-04-24 (×2): 1 mg via INTRAVENOUS

## 2023-04-24 MED ORDER — VERAPAMIL HCL 2.5 MG/ML IV SOLN
INTRAVENOUS | Status: AC
  Filled 2023-04-24: qty 2

## 2023-04-24 SURGICAL SUPPLY — 8 items
CATH INFINITI 5 FR JL3.5 (CATHETERS) IMPLANT
CATH INFINITI AMBI 5FR JK (CATHETERS) IMPLANT
DEVICE RAD COMP TR BAND LRG (VASCULAR PRODUCTS) IMPLANT
GLIDESHEATH SLEND A-KIT 6F 22G (SHEATH) IMPLANT
GUIDEWIRE INQWIRE 1.5J.035X260 (WIRE) IMPLANT
INQWIRE 1.5J .035X260CM (WIRE) ×1
PACK CARDIAC CATHETERIZATION (CUSTOM PROCEDURE TRAY) ×1 IMPLANT
SET ATX-X65L (MISCELLANEOUS) IMPLANT

## 2023-04-24 NOTE — Progress Notes (Signed)
Patient and sister was given discharge instructions. Both verbalized understanding. 

## 2023-04-24 NOTE — Discharge Instructions (Signed)

## 2023-04-24 NOTE — Interval H&P Note (Signed)
History and Physical Interval Note:  04/24/2023 2:01 PM  Charlotte Wallace  has presented today for surgery, with the diagnosis of CAD.  The various methods of treatment have been discussed with the patient and family. After consideration of risks, benefits and other options for treatment, the patient has consented to  Procedure(s): LEFT HEART CATH AND CORONARY ANGIOGRAPHY (N/A) and possible coronary intervention as a surgical intervention.  The patient's history has been reviewed, patient examined, no change in status, stable for surgery.  I have reviewed the patient's chart and labs.  Questions were answered to the patient's satisfaction.    I met the patient personally and her daughter present. Patient having class 3 angina in spite of medical therapy.  Yates Decamp

## 2023-04-27 ENCOUNTER — Encounter: Payer: Self-pay | Admitting: Cardiology

## 2023-04-27 ENCOUNTER — Encounter (HOSPITAL_COMMUNITY): Payer: Self-pay | Admitting: Cardiology

## 2023-04-27 NOTE — Plan of Care (Signed)
[facility] Tonsillectomy/Adenoidectomy, Postoperative PEDS care plan entered in error.

## 2023-05-06 NOTE — Progress Notes (Unsigned)
Cardiology Office Note:  .   Date:  05/07/2023  ID:  Charlotte Wallace, DOB [birth year], MRN [id number] PCP: Erskine Emery, NP  [facility] Providers Cardiologist:  Norman Herrlich, MD    History of Present Illness: [name]   Charlotte Wallace is a 62 y.o. female with a past medical history of CAD per coronary CTA, hypertension, TIA, COPD on O2 3 LPM, hypothyroidism, syncope.  04/25/2023 left heart cath nonstenotic distal LAD lesion, no significant stenosis >> carvedilol and Imdur were increased 03/31/2023 coronary CTA calcium score of 1766, 99th percentile, moderate stenosis in the proximal to mid LAD, proximal to mid RCA, FFR was negative for hemodynamic significance. 09/25/2022 monitor Revealed an average heart rate of 63 bpm, predominantly sinus rhythm with PVCs 2% burden  Most recently evaluated by Dr. Dulce Sellar on 03/05/2023 for chest pain, the decision was made to proceed with coronary CTA. She underwent a coronary CTA on 03/31/2023 revealing a calcium score of 1766, 99th percentile, moderate stenosis in the proximal to mid LAD, proximal to mid RCA, FFR was negative for hemodynamic significance.  Evaluated in our office again on 04/16/2023, decision was made to send her for a left heart cath.  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  She presents today accompanied by her daughter for follow-up after recent left heart cath as outlined above.  She states that she has been feeling better since her carvedilol and Imdur were increased. She denies chest pain, palpitations, dyspnea, pnd, orthopnea, n, v, dizziness, syncope, edema, weight gain, or early satiety.    ROS: Review of Systems  Constitutional:  Positive for malaise/fatigue.  HENT: Negative.    Eyes: Negative.   Respiratory:  Positive for shortness of breath.   Gastrointestinal: Negative.   Genitourinary:  Negative.   Musculoskeletal: Negative.   Skin: Negative.   Neurological: Negative.   Endo/Heme/Allergies: Negative.   Psychiatric/Behavioral: Negative.       Studies Reviewed: .        Cardiac Studies & Procedures   CARDIAC CATHETERIZATION  CARDIAC CATHETERIZATION 04/24/2023  Narrative Images from the original result were not included.    Non-stenotic Dist LAD lesion.  Left Heart Catheterization 04/24/23: Hemodynamic data: LV: 159/13, EDP 25 mmHg.  Ao 160/71, mean 107 mmHg.  No pressure gradient across the aortic valve.  Angiographic data: RCA: Dominant.  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  There is periarterial diffuse calcification without luminal obstruction. LM: Large-caliber vessel.  There is left main calcification without luminal obstruction. LAD: Large caliber vessel.  Gives origin to high D1.  Again there is diffuse calcification noted in the proximal and mid LAD without luminal obstruction.  The distal LAD is mildly ectatic. RI: Small vessel, again mild calcification is noted in the proximal segment. CX: Mild diffuse coronary calcification is evident.  No luminal obstruction.    Impression and recommendations: Chest pain could be related to micro vascular angina, patient has responded to carvedilol and Imdur but only partly, will increase carvedilol to 6.25 mg twice daily and Imdur to 60 mg daily, continue Plavix indefinitely in view of significant coronary and aortic atherosclerosis.  Findings Coronary Findings Diagnostic  Dominance: Right  Left Anterior Descending The vessel exhibits minimal luminal irregularities. Non-stenotic Dist LAD lesion.  Right Coronary Artery The vessel exhibits minimal luminal irregularities. Prox RCA lesion is 10% stenosed.  Intervention  No interventions have been documented.       MONITORS  LONG TERM MONITOR (3-14 DAYS) 09/25/2022           Risk Assessment/Calculations:             Physical Exam:    VS:  BP 135/82 (BP Location: Right Arm, Patient Position: Sitting, Cuff Size: Normal)   Pulse 71   Ht 5\' 5"  (1.651 m)   Wt 169 lb (76.7 kg)   SpO2 90%   BMI 28.12 kg/m    Wt Readings from Last 3 Encounters:  05/07/23 169 lb (76.7 kg)  04/24/23 170 lb (77.1 kg)  04/16/23 172 lb (78 kg)    GEN: Visibly anxious, no acute distress NECK: No JVD; No carotid bruits CARDIAC: RRR, no murmurs, rubs, gallops RESPIRATORY:  Clear to auscultation without rales, wheezing or rhonchi  ABDOMEN: Soft, non-tender, non-distended EXTREMITIES:  No edema; No deformity  SKIN: Left heart cath insertion site on right radial artery healing well, no ecchymosis noted, +2 pulses proximal to and distal from insertion site  ASSESSMENT AND PLAN: .   Coronary artery disease-coronary CTA revealed a calcium score 1766 >> left heart cath revealed no stenotic lesion however diffuse CAD.  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  She is feeling better today, has not noticed as many episodes of chest pain.  Continue Lipitor 40 mg daily, continue Coreg 6.25 mg twice daily, continue Plavix 75 mg daily, continue Imdur 60 mg daily.  Hypertension-blood pressure is controlled today at 135/82,  continue Micardis 40 mg daily, continue carvedilol 6.25 mg mg twice daily.  COPD-followed by local pulmonology physician, currently on oxygen 3 LPM continuously.  Dyslipidemia-we will repeat direct LDL today, continue Lipitor 40 mg daily.  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       Dispo: CMET today, follow up with Dr. Dulce Sellar in  3 months.   Signed, Flossie Dibble, NP

## 2023-05-07 ENCOUNTER — Ambulatory Visit: Payer: 59 | Attending: Cardiology | Admitting: Cardiology

## 2023-05-07 ENCOUNTER — Encounter: Payer: Self-pay | Admitting: Cardiology

## 2023-05-07 VITALS — BP 135/82 | HR 71 | Ht 65.0 in | Wt 169.0 lb

## 2023-05-07 DIAGNOSIS — E785 Hyperlipidemia, unspecified: Secondary | ICD-10-CM

## 2023-05-07 DIAGNOSIS — I10 Essential (primary) hypertension: Secondary | ICD-10-CM

## 2023-05-07 DIAGNOSIS — J449 Chronic obstructive pulmonary disease, unspecified: Secondary | ICD-10-CM | POA: Diagnosis not present

## 2023-05-07 DIAGNOSIS — I25119 Atherosclerotic heart disease of native coronary artery with unspecified angina pectoris: Secondary | ICD-10-CM | POA: Diagnosis not present

## 2023-05-07 DIAGNOSIS — Z8673 Personal history of transient ischemic attack (TIA), and cerebral infarction without residual deficits: Secondary | ICD-10-CM

## 2023-05-07 NOTE — Patient Instructions (Signed)
Medication Instructions:  Your physician recommends that you continue on your current medications as directed. Please refer to the Current Medication list given to you today.  *If you need a refill on your cardiac medications before your next appointment, please call your pharmacy*   Lab Work: Your physician recommends that you return for lab work in: Today for  BMP and Direct LDL  If you have labs (blood work) drawn today and your tests are completely normal, you will receive your results only by: MyChart Message (if you have MyChart) OR A paper copy in the mail If you have any lab test that is abnormal or we need to change your treatment, we will call you to review the results.   Testing/Procedures: NONE   Follow-Up: At [facility], you and your health needs are our priority.  As part of our continuing mission to provide you with exceptional heart care, we have created designated Provider Care Teams.  These Care Teams include your primary Cardiologist (physician) and Advanced Practice Providers (APPs -  Physician Assistants and Nurse Practitioners) who all work together to provide you with the care you need, when you need it.  We recommend signing up for the patient portal called "MyChart".  Sign up information is provided on this After Visit Summary.  MyChart is used to connect with patients for Virtual Visits (Telemedicine).  Patients are able to view lab/test results, encounter notes, upcoming appointments, etc.  Non-urgent messages can be sent to your provider as well.   To learn more about what you can do with MyChart, go to [email or web address].    Your next appointment:   3 month(s)  Provider:   Norman Herrlich, MD    Other Instructions

## 2023-05-08 LAB — BASIC METABOLIC PANEL WITH GFR
BUN/Creatinine Ratio: 19 (ref 12–28)
BUN: 15 mg/dL (ref 8–27)
CO2: 24 mmol/L (ref 20–29)
Calcium: 9.5 mg/dL (ref 8.7–10.3)
Chloride: 105 mmol/L (ref 96–106)
Creatinine, Ser: 0.79 mg/dL (ref 0.57–1.00)
Glucose: 103 mg/dL — ABNORMAL HIGH (ref 70–99)
Potassium: 4.3 mmol/L (ref 3.5–5.2)
Sodium: 141 mmol/L (ref 134–144)
eGFR: 83 mL/min/1.73

## 2023-05-08 LAB — LDL CHOLESTEROL, DIRECT: LDL Direct: 61 mg/dL (ref 0–99)

## 2023-05-11 ENCOUNTER — Telehealth: Payer: Self-pay

## 2023-05-11 NOTE — Telephone Encounter (Signed)
Pt saw Wallis Bamberg NP for follow up to discuss CT Angio on 05-07-23. Routed to PCP.

## 2023-05-16 ENCOUNTER — Other Ambulatory Visit: Payer: Self-pay | Admitting: Cardiology

## 2023-05-18 ENCOUNTER — Other Ambulatory Visit: Payer: Self-pay | Admitting: Family

## 2023-05-18 DIAGNOSIS — Z1231 Encounter for screening mammogram for malignant neoplasm of breast: Secondary | ICD-10-CM

## 2023-05-22 ENCOUNTER — Ambulatory Visit
Admission: RE | Admit: 2023-05-22 | Discharge: 2023-05-22 | Disposition: A | Discharge status: Home / Self Care | Payer: 59 | Source: Ambulatory Visit | Attending: Family | Admitting: Family

## 2023-05-22 DIAGNOSIS — Z1231 Encounter for screening mammogram for malignant neoplasm of breast: Secondary | ICD-10-CM

## 2023-06-25 ENCOUNTER — Telehealth: Payer: Self-pay | Admitting: Cardiology

## 2023-06-25 NOTE — Telephone Encounter (Signed)
Pt c/o medication issue:  1. Name of Medication: Imdur  2. How are you currently taking this medication (dosage and times per day)? N/A  3. Are you having a reaction (difficulty breathing--STAT)? no  4. What is your medication issue? Message received from [facility] [employer] [facility]. Dee from [facility] called to clarify this medication with his doctor.

## 2023-07-10 ENCOUNTER — Telehealth: Payer: Self-pay | Admitting: Cardiology

## 2023-07-10 ENCOUNTER — Other Ambulatory Visit: Payer: Self-pay

## 2023-07-10 MED ORDER — ISOSORBIDE MONONITRATE ER 60 MG PO TB24: 60.0000 mg | ORAL_TABLET | Freq: Every day | ORAL | 3 refills | Status: AC

## 2023-07-10 NOTE — Telephone Encounter (Signed)
*  STAT* If patient is at the pharmacy, call can be transferred to refill team.   1. Which medications need to be refilled? (please list name of each medication and dose if known) isosorbide  mononitrate (IMDUR ) 60 MG 24 hr tablet   2. Which pharmacy/location (including street and city if local pharmacy) is medication to be sent to? [id number] - [place], [place] - [address]   3. Do they need a 30 day or 90 day supply? 90

## 2023-08-06 ENCOUNTER — Encounter: Payer: Self-pay | Admitting: Cardiology

## 2023-08-06 NOTE — Progress Notes (Signed)
Cardiology Office Note:    Date:  08/06/2023   ID:  [name], DOB 07-04-1961, MRN [id number]  PCP:  Erskine Emery, NP  Cardiologist:  Norman Herrlich, MD    Referring MD: Erskine Emery, NP    ASSESSMENT:    1. Microvascular angina (HCC)   2. 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 Essential hypertension   5. Hyperlipidemia LDL goal <70   6. Chronic obstructive pulmonary disease, unspecified COPD type (HCC)    PLAN:    In order of problems listed above:  She is doing much better with her microvascular angina no anginal discomfort and will continue medical therapy including the recently initiated oral mononitrate Imdur 60 mg daily continue her 3-day a week clopidogrel carvedilol and lipid-lowering with a high intensity statin atorvastatin Blood pressure at target continue medical treatment carvedilol Continue her statin labs are followed in her PCP office Stable COPD with ambulatory oxygen she will continue her bronchodilators   Next appointment: I will plan to see her in 1 year   Medication Adjustments/Labs and Tests Ordered: Current medicines are reviewed at length with the patient today.  Concerns regarding medicines are outlined above.  No orders of the defined types were placed in this encounter.  No orders of the defined types were placed in this encounter.    History of Present Illness:    Charlotte Wallace is a 63 y.o. female with a hx of anginal chest pain with coronary artery calcification on CT scan hypertension hyperlipidemia and COPD last seen 03/05/2023.  She underwent left heart catheterization 04/24/2023 showing anomalous origin of the right coronary artery for the left coronary cusp she had no severe obstructive CAD although she had a great deal of coronary artery calcification and was felt to have microvascular angina.  Compliance with diet,  lifestyle and medications: Yes  She has done very well after total hip arthroplasty for traumatic hip fracture.  In hospital she was started on Lovenox by the hospitalist team she is taken in excess of 5 weeks cannot get a discrete answer and I told her I think that at this point that he risk of her anticoagulant is exceeding the benefit and she should stop. 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/05/2020   Anxiety 02/27/2016   Asthma    Chronic anticoagulation 01/10/2016   Chronic back pain 02/27/2016   COPD (chronic obstructive pulmonary disease) (HCC) 02/27/2016   Depression 02/27/2016   Diarrhea 02/27/2016   Dyspnea    Elevated liver function tests 02/28/2016   Facet arthritis of lumbar region 01/10/2016   Fatigue    Generalized pain 02/27/2016   Hypertension 02/27/2016   Hypothyroidism 02/27/2016   Leukocytosis 02/28/2016   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      EKGs/Labs/Other Studies Reviewed:    The following studies were reviewed today:  Cardiac Studies & Procedures   CARDIAC CATHETERIZATION  CARDIAC CATHETERIZATION 04/24/2023  Narrative Images  from the original result were not included.    Non-stenotic Dist LAD lesion.  Left Heart Catheterization 04/24/23: Hemodynamic data: LV: 159/13, EDP 25 mmHg.  Ao 160/71, mean 107 mmHg.  No pressure gradient across the aortic valve.  Angiographic data: RCA: Dominant.  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  There is periarterial diffuse calcification without luminal obstruction. LM: Large-caliber vessel.  There is left main calcification without luminal obstruction. LAD: Large caliber vessel.  Gives origin to high D1.  Again there is diffuse calcification noted in the proximal and mid LAD without luminal obstruction.  The distal LAD is mildly ectatic. RI: Small vessel, again mild calcification is noted in the proximal segment. CX: Mild diffuse coronary calcification is evident.  No luminal obstruction.    Impression and recommendations: Chest pain could be related to micro vascular angina, patient has responded to carvedilol and Imdur but only partly, will increase carvedilol to 6.25 mg twice daily and Imdur to 60 mg daily, continue Plavix indefinitely in view of significant coronary and aortic atherosclerosis.  Findings Coronary Findings Diagnostic  Dominance: Right  Left Anterior Descending The vessel exhibits minimal luminal irregularities. Non-stenotic Dist LAD lesion.  Right Coronary Artery The vessel exhibits minimal luminal irregularities. Prox RCA lesion is 10% stenosed.  Intervention  No interventions have been documented.      MONITORS  LONG TERM MONITOR (3-14 DAYS) 09/25/2022               Recent Labs: 04/16/2023: Hemoglobin 11.8; Platelets 311 05/07/2023: BUN 15; Creatinine, Ser 0.79; Potassium 4.3; Sodium 141  Recent Lipid Panel    Component Value Date/Time   CHOL  04/10/2008 0200    128        ATP III CLASSIFICATION:  <200     mg/dL   Desirable  098-119  mg/dL   Borderline High  >=147    mg/dL   High   TRIG 829 56/21/3086 0200   HDL 48  04/10/2008 0200   CHOLHDL 2.7 04/10/2008 0200   VLDL 21 04/10/2008 0200   LDLCALC  04/10/2008 0200    59        Total Cholesterol/HDL:CHD Risk Coronary Heart Disease Risk Table                     Men   Women  1/2 Average Risk   3.4   3.3   LDLDIRECT 61 05/07/2023 1102    Physical Exam:    VS:  There were no vitals taken for this visit.    Wt Readings from Last 3 Encounters:  05/07/23 169 lb (76.7 kg)  04/24/23 170 lb (77.1 kg)  04/16/23 172 lb (78 kg)     GEN:  Well nourished, well developed in no acute distress HEENT: Normal NECK: No JVD; No carotid bruits LYMPHATICS: No lymphadenopathy CARDIAC: RRR, no murmurs, rubs, gallops RESPIRATORY:  Clear to auscultation without rales, wheezing or rhonchi  ABDOMEN: Soft, non-tender, non-distended MUSCULOSKELETAL:  No edema; No deformity  SKIN: Warm and dry NEUROLOGIC:  Alert and oriented x 3 PSYCHIATRIC:  Normal affect    Signed, Norman Herrlich, MD  08/06/2023 7:38  PM    [facility]

## 2023-08-07 ENCOUNTER — Encounter: Payer: Self-pay | Admitting: Cardiology

## 2023-08-07 ENCOUNTER — Ambulatory Visit: Payer: 59 | Attending: Cardiology | Admitting: Cardiology

## 2023-08-07 ENCOUNTER — Other Ambulatory Visit: Payer: Self-pay

## 2023-08-07 VITALS — BP 142/72 | HR 70 | Ht 65.0 in | Wt 172.6 lb

## 2023-08-07 DIAGNOSIS — Q245 Malformation of coronary vessels: Secondary | ICD-10-CM | POA: Diagnosis not present

## 2023-08-07 DIAGNOSIS — E785 Hyperlipidemia, unspecified: Secondary | ICD-10-CM | POA: Diagnosis not present

## 2023-08-07 DIAGNOSIS — J449 Chronic obstructive pulmonary disease, unspecified: Secondary | ICD-10-CM

## 2023-08-07 DIAGNOSIS — I2089 Other forms of angina pectoris: Secondary | ICD-10-CM

## 2023-08-07 DIAGNOSIS — I25118 Atherosclerotic heart disease of native coronary artery with other forms of angina pectoris: Secondary | ICD-10-CM

## 2023-08-07 DIAGNOSIS — I10 Essential (primary) hypertension: Secondary | ICD-10-CM | POA: Diagnosis not present

## 2023-08-07 DIAGNOSIS — I25119 Atherosclerotic heart disease of native coronary artery with unspecified angina pectoris: Secondary | ICD-10-CM

## 2023-08-07 NOTE — Patient Instructions (Signed)
Medication Instructions:  Your physician has recommended you make the following change in your medication:   STOP: Lovenox  *If you need a refill on your cardiac medications before your next appointment, please call your pharmacy*   Lab Work: None If you have labs (blood work) drawn today and your tests are completely normal, you will receive your results only by: MyChart Message (if you have MyChart) OR A paper copy in the mail If you have any lab test that is abnormal or we need to change your treatment, we will call you to review the results.   Testing/Procedures: None   Follow-Up: At [facility], you and your health needs are our priority.  As part of our continuing mission to provide you with exceptional heart care, we have created designated Provider Care Teams.  These Care Teams include your primary Cardiologist (physician) and Advanced Practice Providers (APPs -  Physician Assistants and Nurse Practitioners) who all work together to provide you with the care you need, when you need it.  We recommend signing up for the patient portal called "MyChart".  Sign up information is provided on this After Visit Summary.  MyChart is used to connect with patients for Virtual Visits (Telemedicine).  Patients are able to view lab/test results, encounter notes, upcoming appointments, etc.  Non-urgent messages can be sent to your provider as well.   To learn more about what you can do with MyChart, go to [email or web address].    Your next appointment:   1 year(s)  Provider:   Norman Herrlich, MD    Other Instructions None

## 2024-03-09 ENCOUNTER — Other Ambulatory Visit: Payer: Self-pay | Admitting: Cardiology

## 2024-05-27 ENCOUNTER — Other Ambulatory Visit: Payer: Self-pay | Admitting: Family

## 2024-05-27 DIAGNOSIS — Z1231 Encounter for screening mammogram for malignant neoplasm of breast: Secondary | ICD-10-CM

## 2024-06-14 ENCOUNTER — Inpatient Hospital Stay: Admission: RE | Admit: 2024-06-14 | Discharge: 2024-06-14 | Attending: Family | Admitting: Family

## 2024-06-14 DIAGNOSIS — Z1231 Encounter for screening mammogram for malignant neoplasm of breast: Secondary | ICD-10-CM

## 2024-08-08 ENCOUNTER — Ambulatory Visit: Admitting: Cardiology

## 2024-09-21 ENCOUNTER — Ambulatory Visit: Admitting: Cardiology

## 2024-09-22 ENCOUNTER — Ambulatory Visit: Admitting: Cardiology
